# Patient Record
Sex: Female | Born: 1995 | Race: Black or African American | Hispanic: No | Marital: Single | Smoking: Never smoker
Health system: Southern US, Community
[De-identification: ages and names within clinical notes are randomized; demographics above are authoritative.]

---
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
## 2015-11-15 ENCOUNTER — Encounter (HOSPITAL_COMMUNITY): Payer: Self-pay | Admitting: Family Medicine

## 2015-11-15 ENCOUNTER — Emergency Department (HOSPITAL_COMMUNITY)
Admission: EM | Admit: 2015-11-15 | Discharge: 2015-11-15 | Disposition: A | Discharge status: Home / Self Care | Payer: No Typology Code available for payment source | Attending: Emergency Medicine | Admitting: Emergency Medicine

## 2015-11-15 DIAGNOSIS — Y998 Other external cause status: Secondary | ICD-10-CM | POA: Insufficient documentation

## 2015-11-15 DIAGNOSIS — S29002A Unspecified injury of muscle and tendon of back wall of thorax, initial encounter: Secondary | ICD-10-CM | POA: Diagnosis not present

## 2015-11-15 DIAGNOSIS — Z791 Long term (current) use of non-steroidal anti-inflammatories (NSAID): Secondary | ICD-10-CM | POA: Diagnosis not present

## 2015-11-15 DIAGNOSIS — S199XXA Unspecified injury of neck, initial encounter: Secondary | ICD-10-CM | POA: Insufficient documentation

## 2015-11-15 DIAGNOSIS — Y9389 Activity, other specified: Secondary | ICD-10-CM | POA: Diagnosis not present

## 2015-11-15 DIAGNOSIS — M542 Cervicalgia: Secondary | ICD-10-CM

## 2015-11-15 DIAGNOSIS — S8992XA Unspecified injury of left lower leg, initial encounter: Secondary | ICD-10-CM | POA: Insufficient documentation

## 2015-11-15 DIAGNOSIS — S0990XA Unspecified injury of head, initial encounter: Secondary | ICD-10-CM | POA: Diagnosis not present

## 2015-11-15 DIAGNOSIS — Y92410 Unspecified street and highway as the place of occurrence of the external cause: Secondary | ICD-10-CM | POA: Insufficient documentation

## 2015-11-15 MED ORDER — IBUPROFEN 400 MG PO TABS: 400.0000 mg | ORAL_TABLET | Freq: Three times a day (TID) | ORAL | Status: DC

## 2015-11-15 MED ORDER — CYCLOBENZAPRINE HCL 10 MG PO TABS: 10.0000 mg | ORAL_TABLET | Freq: Two times a day (BID) | ORAL | Status: DC | PRN

## 2015-11-15 MED ORDER — CYCLOBENZAPRINE HCL 10 MG PO TABS
10.0000 mg | ORAL_TABLET | Freq: Once | ORAL | Status: AC
  Administered 2015-11-15: 10 mg via ORAL
  Filled 2015-11-15: qty 1

## 2015-11-15 MED ORDER — IBUPROFEN 400 MG PO TABS
800.0000 mg | ORAL_TABLET | Freq: Once | ORAL | Status: AC
  Administered 2015-11-15: 800 mg via ORAL
  Filled 2015-11-15: qty 2

## 2015-11-15 MED ORDER — TRAMADOL HCL 50 MG PO TABS: 50.0000 mg | ORAL_TABLET | Freq: Four times a day (QID) | ORAL | Status: DC | PRN
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
## 2015-11-15 NOTE — ED Notes (Signed)
Patient able to ambulate independently  

## 2015-11-15 NOTE — Discharge Instructions (Signed)
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 exercises prescribed by your provider will help you have the flexibility and strength to maintain correct postures.  The correct posture provides the optimal environment for your joints to work. All of your joints have less wear and tear when properly supported by a spine with good posture. This means you will experience a healthier, less painful body.  Correct posture must be practiced with all of your activities, especially prolonged sitting and standing. Correct posture is as important when doing repetitive low-stress activities (typing) as it is when doing a single heavy-load activity (lifting). PROLONGED STANDING WHILE SLIGHTLY LEANING FORWARD When completing a task that requires you to lean forward while standing in one place for a long time, place either foot up on a  stationary 2- to 4-inch high object to help maintain the best posture. When both feet are on the ground, the low back  tends to lose its slight inward curve. If this curve flattens (or becomes too large), then the back and your other joints will experience too much stress, fatigue more quickly, and can cause pain.  RESTING POSITIONS Consider which positions are most painful for you when choosing a resting position. If you have pain with flexion-based activities (sitting, bending, stooping, squatting), choose a position that allows you to rest in a less flexed posture. You would want to avoid curling into a fetal position on your side. If your pain worsens with extension-based activities (prolonged standing, working overhead), avoid resting in an extended position such as sleeping on your stomach. Most people will find more comfort when they rest with their spine in a more neutral position, neither too rounded nor too arched. Lying on a non-sagging bed on your side with a pillow between your knees, or on your back with a pillow under your knees will often provide some relief. Keep in mind, being in any one position for a prolonged period of time, no matter how correct your posture, can still lead to stiffness. WALKING Walk with an upright posture. Your ears, shoulders, and hips should all line up. OFFICE WORK When working at a desk, create an environment that supports good, upright posture. Without extra support, muscles fatigue and lead to excessive strain on joints and other tissues. CHAIR:  A chair should be able to slide under your desk when your back makes contact with the back of the chair. This allows you to work closely.  The chair's height should allow your eyes to be level with the upper part of your monitor and your hands to be slightly lower than your elbows.  Body position:  Your feet should make contact with the floor. If this is not possible, use a foot rest.  Keep your ears over your shoulders. This will reduce stress on your neck and low back.   This information is not intended to replace advice  given to you by your health care provider. Make sure you discuss any questions you have with your health care provider.   Document Released: 09/08/2005 Document Revised: 09/29/2014 Document Reviewed: 12/21/2008 Elsevier Interactive Patient Education 2016 [employer]  Head Injury, Adult You have a head injury. Headaches and throwing up (vomiting) are common after a head injury. It should be easy to wake up from sleeping. Sometimes you must stay in the hospital. Most problems happen within the first 24 hours. Side effects may occur up to 7-10 days after the injury.  WHAT ARE THE TYPES OF HEAD INJURIES? Head injuries can be as minor as a bump. Some head injuries can be more severe. More severe head injuries include:  A jarring injury to the brain (concussion).  A bruise of the brain (contusion). This mean there is bleeding in the brain that can cause swelling.  A cracked skull (skull fracture).  Bleeding in the brain that collects, clots, and forms a bump (hematoma). WHEN SHOULD I GET HELP RIGHT AWAY?   You are confused or sleepy.  You cannot be woken up.  You feel sick to your stomach (nauseous) or keep throwing up (vomiting).  Your dizziness or unsteadiness is getting worse.  You have very bad, lasting headaches that are not helped by medicine. Take medicines only as told by your doctor.  You cannot use your arms or legs  like normal.  You cannot walk.  You notice changes in the black spots in the center of the colored part of your eye (pupil).  You have clear or bloody fluid coming from your nose or ears.  You have trouble seeing. During the next 24 hours after the injury, you must stay with someone who can watch you. This person should get help right away (call 911 in the [place] if you start to shake and are not able to control it (have seizures), you pass out, or you are unable to wake up. HOW CAN I PREVENT A HEAD INJURY IN THE FUTURE?  Wear seat belts.  Wear a helmet  while bike riding and playing sports like football.  Stay away from dangerous activities around the house. WHEN CAN I RETURN TO NORMAL ACTIVITIES AND ATHLETICS? See your doctor before doing these activities. You should not do normal activities or play contact sports until 1 week after the following symptoms have stopped:  Headache that does not go away.  Dizziness.  Poor attention.  Confusion.  Memory problems.  Sickness to your stomach or throwing up.  Tiredness.  Fussiness.  Bothered by bright lights or loud noises.  Anxiousness or depression.  Restless sleep. MAKE SURE YOU:   Understand these instructions.  Will watch your condition.  Will get help right away if you are not doing well or get worse.   This information is not intended to replace advice given to you by your health care provider. Make sure you discuss any questions you have with your health care provider.   Document Released: 08/21/2008 Document Revised: 09/29/2014 Document Reviewed: 05/16/2013 Elsevier Interactive Patient Education [employer]
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
## 2015-12-07 ENCOUNTER — Emergency Department (HOSPITAL_COMMUNITY): Payer: Self-pay

## 2015-12-07 ENCOUNTER — Emergency Department (HOSPITAL_COMMUNITY)
Admission: EM | Admit: 2015-12-07 | Discharge: 2015-12-07 | Disposition: A | Discharge status: Home / Self Care | Payer: Self-pay | Attending: Emergency Medicine | Admitting: Emergency Medicine

## 2015-12-07 ENCOUNTER — Encounter (HOSPITAL_COMMUNITY): Payer: Self-pay | Admitting: Physical Medicine and Rehabilitation

## 2015-12-07 DIAGNOSIS — Y9389 Activity, other specified: Secondary | ICD-10-CM | POA: Insufficient documentation

## 2015-12-07 DIAGNOSIS — M542 Cervicalgia: Secondary | ICD-10-CM

## 2015-12-07 DIAGNOSIS — S199XXA Unspecified injury of neck, initial encounter: Secondary | ICD-10-CM | POA: Insufficient documentation

## 2015-12-07 DIAGNOSIS — Y92410 Unspecified street and highway as the place of occurrence of the external cause: Secondary | ICD-10-CM | POA: Insufficient documentation

## 2015-12-07 DIAGNOSIS — Y998 Other external cause status: Secondary | ICD-10-CM | POA: Insufficient documentation

## 2015-12-07 DIAGNOSIS — S0990XA Unspecified injury of head, initial encounter: Secondary | ICD-10-CM | POA: Insufficient documentation

## 2015-12-07 DIAGNOSIS — S8991XA Unspecified injury of right lower leg, initial encounter: Secondary | ICD-10-CM | POA: Insufficient documentation

## 2015-12-07 MED ORDER — IBUPROFEN 800 MG PO TABS: 800.0000 mg | ORAL_TABLET | Freq: Three times a day (TID) | ORAL | Status: DC

## 2015-12-07 MED ORDER — OXYCODONE-ACETAMINOPHEN 5-325 MG PO TABS
2.0000 | ORAL_TABLET | Freq: Once | ORAL | Status: AC
  Administered 2015-12-07: 2 via ORAL
  Filled 2015-12-07: qty 2

## 2015-12-07 MED ORDER — HYDROCODONE-ACETAMINOPHEN 5-325 MG PO TABS: 1.0000 | ORAL_TABLET | Freq: Four times a day (QID) | ORAL | Status: DC | PRN

## 2015-12-07 MED ORDER — CYCLOBENZAPRINE HCL 10 MG PO TABS: 10.0000 mg | ORAL_TABLET | Freq: Two times a day (BID) | ORAL | Status: DC | PRN

## 2015-12-07 NOTE — Progress Notes (Signed)
Orthopedic Tech Progress Note Patient Details:  Anita Knapp 1996/09/03 [id number]  Ortho Devices Type of Ortho Device: Knee Immobilizer Ortho Device/Splint Interventions: Application   Anita Knapp 12/07/2015, 1:58 PM
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
## 2015-12-07 NOTE — ED Notes (Addendum)
[facility] called for bariatric walker.

## 2015-12-07 NOTE — ED Notes (Signed)
Pt states she does not want walker due to insurance coverage. Pt discharged home.

## 2015-12-07 NOTE — ED Notes (Signed)
Patient wanting to go ahead and leave; patient informed by Aundra MilletMegan, RN that a walker has already been ordered and [facility] is sending the walker to the hospital via courier; patient is willing to wait; visitor at bedside

## 2015-12-07 NOTE — ED Notes (Signed)
Pt transported to radiology.

## 2015-12-07 NOTE — ED Notes (Signed)
Pt refusing walker due to cost, pt family member states she has one at home. Pt given a wheelchair out

## 2015-12-07 NOTE — ED Notes (Signed)
Patient undressed, in gown, on continuous pulse oximetry and blood pressure cuff 

## 2015-12-07 NOTE — Discharge Instructions (Signed)
Concussion, Adult °A concussion, or closed-head injury, is a brain injury caused by a direct blow to the head or by a quick and sudden movement (jolt) of the head or neck. Concussions are usually not life-threatening. Even so, the effects of a concussion can be serious. If you have had a concussion before, you are more likely to experience concussion-like symptoms after a direct blow to the head.  °CAUSES °· Direct blow to the head, such as from running into another player during a soccer game, being hit in a fight, or hitting your head on a hard surface. °· A jolt of the head or neck that causes the brain to move back and forth inside the skull, such as in a car crash. °SIGNS AND SYMPTOMS °The signs of a concussion can be hard to notice. Early on, they may be missed by you, family members, and health care providers. You may look fine but act or feel differently. °Symptoms are usually temporary, but they may last for days, weeks, or even longer. Some symptoms may appear right away while others may not show up for hours or days. Every head injury is different. Symptoms include: °· Mild to moderate headaches that will not go away. °· A feeling of pressure inside your head. °· Having more trouble than usual: °¨ Learning or remembering things you have heard. °¨ Answering questions. °¨ Paying attention or concentrating. °¨ Organizing daily tasks. °¨ Making decisions and solving problems. °· Slowness in thinking, acting or reacting, speaking, or reading. °· Getting lost or being easily confused. °· Feeling tired all the time or lacking energy (fatigued). °· Feeling drowsy. °· Sleep disturbances. °¨ Sleeping more than usual. °¨ Sleeping less than usual. °¨ Trouble falling asleep. °¨ Trouble sleeping (insomnia). °· Loss of balance or feeling lightheaded or dizzy. °· Nausea or vomiting. °· Numbness or tingling. °· Increased sensitivity to: °¨ Sounds. °¨ Lights. °¨ Distractions. °· Vision problems or eyes that tire  easily. °· Diminished sense of taste or smell. °· Ringing in the ears. °· Mood changes such as feeling sad or anxious. °· Becoming easily irritated or angry for little or no reason. °· Lack of motivation. °· Seeing or hearing things other people do not see or hear (hallucinations). °DIAGNOSIS °Your health care provider can usually diagnose a concussion based on a description of your injury and symptoms. He or she will ask whether you passed out (lost consciousness) and whether you are having trouble remembering events that happened right before and during your injury. °Your evaluation might include: °· A brain scan to look for signs of injury to the brain. Even if the test shows no injury, you may still have a concussion. °· Blood tests to be sure other problems are not present. °TREATMENT °· Concussions are usually treated in an emergency department, in urgent care, or at a clinic. You may need to stay in the hospital overnight for further treatment. °· Tell your health care provider if you are taking any medicines, including prescription medicines, over-the-counter medicines, and natural remedies. Some medicines, such as blood thinners (anticoagulants) and aspirin, may increase the chance of complications. Also tell your health care provider whether you have had alcohol or are taking illegal drugs. This information may affect treatment. °· Your health care provider will send you home with important instructions to follow. °· How fast you will recover from a concussion depends on many factors. These factors include how severe your concussion is, what part of your brain was injured,   your age, and how healthy you were before the concussion.  Most people with mild injuries recover fully. Recovery can take time. In general, recovery is slower in older persons. Also, persons who have had a concussion in the past or have other medical problems may find that it takes longer to recover from their current injury. HOME  CARE INSTRUCTIONS General Instructions  Carefully follow the directions your health care provider gave you.  Only take over-the-counter or prescription medicines for pain, discomfort, or fever as directed by your health care provider.  Take only those medicines that your health care provider has approved.  Do not drink alcohol until your health care provider says you are well enough to do so. Alcohol and certain other drugs may slow your recovery and can put you at risk of further injury.  If it is harder than usual to remember things, write them down.  If you are easily distracted, try to do one thing at a time. For example, do not try to watch TV while fixing dinner.  Talk with family members or close friends when making important decisions.  Keep all follow-up appointments. Repeated evaluation of your symptoms is recommended for your recovery.  Watch your symptoms and tell others to do the same. Complications sometimes occur after a concussion. Older adults with a brain injury may have a higher risk of serious complications, such as a blood clot on the brain.  Tell your teachers, school nurse, school counselor, coach, athletic trainer, or work Freight forwarder about your injury, symptoms, and restrictions. Tell them about what you can or cannot do. They should watch for:  Increased problems with attention or concentration.  Increased difficulty remembering or learning new information.  Increased time needed to complete tasks or assignments.  Increased irritability or decreased ability to cope with stress.  Increased symptoms.  Rest. Rest helps the brain to heal. Make sure you:  Get plenty of sleep at night. Avoid staying up late at night.  Keep the same bedtime hours on weekends and weekdays.  Rest during the day. Take daytime naps or rest breaks when you feel tired.  Limit activities that require a lot of thought or concentration. These include:  Doing homework or job-related  work.  Watching TV.  Working on the computer.  Avoid any situation where there is potential for another head injury (football, hockey, soccer, basketball, martial arts, downhill snow sports and horseback riding). Your condition will get worse every time you experience a concussion. You should avoid these activities until you are evaluated by the appropriate follow-up health care providers. Returning To Your Regular Activities You will need to return to your normal activities slowly, not all at once. You must give your body and brain enough time for recovery.  Do not return to sports or other athletic activities until your health care provider tells you it is safe to do so.  Ask your health care provider when you can drive, ride a bicycle, or operate heavy machinery. Your ability to react may be slower after a brain injury. Never do these activities if you are dizzy.  Ask your health care provider about when you can return to work or school. Preventing Another Concussion It is very important to avoid another brain injury, especially before you have recovered. In rare cases, another injury can lead to permanent brain damage, brain swelling, or death. The risk of this is greatest during the first 7-10 days after a head injury. Avoid injuries by:  Wearing a  seat belt when riding in a car.  Drinking alcohol only in moderation.  Wearing a helmet when biking, skiing, skateboarding, skating, or doing similar activities.  Avoiding activities that could lead to a second concussion, such as contact or recreational sports, until your health care provider says it is okay.  Taking safety measures in your home.  Remove clutter and tripping hazards from floors and stairways.  Use grab bars in bathrooms and handrails by stairs.  Place non-slip mats on floors and in bathtubs.  Improve lighting in dim areas. SEEK MEDICAL CARE IF:  You have increased problems paying attention or  concentrating.  You have increased difficulty remembering or learning new information.  You need more time to complete tasks or assignments than before.  You have increased irritability or decreased ability to cope with stress.  You have more symptoms than before. Seek medical care if you have any of the following symptoms for more than 2 weeks after your injury:  Lasting (chronic) headaches.  Dizziness or balance problems.  Nausea.  Vision problems.  Increased sensitivity to noise or light.  Depression or mood swings.  Anxiety or irritability.  Memory problems.  Difficulty concentrating or paying attention.  Sleep problems.  Feeling tired all the time. SEEK IMMEDIATE MEDICAL CARE IF:  You have severe or worsening headaches. These may be a sign of a blood clot in the brain.  You have weakness (even if only in one hand, leg, or part of the face).  You have numbness.  You have decreased coordination.  You vomit repeatedly.  You have increased sleepiness.  One pupil is larger than the other.  You have convulsions.  You have slurred speech.  You have increased confusion. This may be a sign of a blood clot in the brain.  You have increased restlessness, agitation, or irritability.  You are unable to recognize people or places.  You have neck pain.  It is difficult to wake you up.  You have unusual behavior changes.  You lose consciousness. MAKE SURE YOU:  Understand these instructions.  Will watch your condition.  Will get help right away if you are not doing well or get worse.   This information is not intended to replace advice given to you by your health care provider. Make sure you discuss any questions you have with your health care provider.   Document Released: 11/29/2003 Document Revised: 09/29/2014 Document Reviewed: 03/31/2013 [employer] Interactive Patient Education 2016 [employer]  Cervical Strain and Sprain With Rehab Cervical  strain and sprain are injuries that commonly occur with "whiplash" injuries. Whiplash occurs when the neck is forcefully whipped backward or forward, such as during a motor vehicle accident or during contact sports. The muscles, ligaments, tendons, discs, and nerves of the neck are susceptible to injury when this occurs. RISK FACTORS Risk of having a whiplash injury increases if:  Osteoarthritis of the spine.  Situations that make head or neck accidents or trauma more likely.  High-risk sports (football, rugby, wrestling, hockey, auto racing, gymnastics, diving, contact karate, or boxing).  Poor strength and flexibility of the neck.  Previous neck injury.  Poor tackling technique.  Improperly fitted or padded equipment. SYMPTOMS   Pain or stiffness in the front or back of neck or both.  Symptoms may present immediately or up to 24 hours after injury.  Dizziness, headache, nausea, and vomiting.  Muscle spasm with soreness and stiffness in the neck.  Tenderness and swelling at the injury site. PREVENTION  Learn  and use proper technique (avoid tackling with the head, spearing, and head-butting; use proper falling techniques to avoid landing on the head).  Warm up and stretch properly before activity.  Maintain physical fitness:  Strength, flexibility, and endurance.  Cardiovascular fitness.  Wear properly fitted and padded protective equipment, such as padded soft collars, for participation in contact sports. PROGNOSIS  Recovery from cervical strain and sprain injuries is dependent on the extent of the injury. These injuries are usually curable in 1 week to 3 months with appropriate treatment.  RELATED COMPLICATIONS   Temporary numbness and weakness may occur if the nerve roots are damaged, and this may persist until the nerve has completely healed.  Chronic pain due to frequent recurrence of symptoms.  Prolonged healing, especially if activity is resumed too soon  (before complete recovery). TREATMENT  Treatment initially involves the use of ice and medication to help reduce pain and inflammation. It is also important to perform strengthening and stretching exercises and modify activities that worsen symptoms so the injury does not get worse. These exercises may be performed at home or with a therapist. For patients who experience severe symptoms, a soft, padded collar may be recommended to be worn around the neck.  Improving your posture may help reduce symptoms. Posture improvement includes pulling your chin and abdomen in while sitting or standing. If you are sitting, sit in a firm chair with your buttocks against the back of the chair. While sleeping, try replacing your pillow with a small towel rolled to 2 inches in diameter, or use a cervical pillow or soft cervical collar. Poor sleeping positions delay healing.  For patients with nerve root damage, which causes numbness or weakness, the use of a cervical traction apparatus may be recommended. Surgery is rarely necessary for these injuries. However, cervical strain and sprains that are present at birth (congenital) may require surgery. MEDICATION   If pain medication is necessary, nonsteroidal anti-inflammatory medications, such as aspirin and ibuprofen, or other minor pain relievers, such as acetaminophen, are often recommended.  Do not take pain medication for 7 days before surgery.  Prescription pain relievers may be given if deemed necessary by your caregiver. Use only as directed and only as much as you need. HEAT AND COLD:   Cold treatment (icing) relieves pain and reduces inflammation. Cold treatment should be applied for 10 to 15 minutes every 2 to 3 hours for inflammation and pain and immediately after any activity that aggravates your symptoms. Use ice packs or an ice massage.  Heat treatment may be used prior to performing the stretching and strengthening activities prescribed by your  caregiver, physical therapist, or athletic trainer. Use a heat pack or a warm soak. SEEK MEDICAL CARE IF:   Symptoms get worse or do not improve in 2 weeks despite treatment.  New, unexplained symptoms develop (drugs used in treatment may produce side effects). EXERCISES RANGE OF MOTION (ROM) AND STRETCHING EXERCISES - Cervical Strain and Sprain These exercises may help you when beginning to rehabilitate your injury. In order to successfully resolve your symptoms, you must improve your posture. These exercises are designed to help reduce the forward-head and rounded-shoulder posture which contributes to this condition. Your symptoms may resolve with or without further involvement from your physician, physical therapist or athletic trainer. While completing these exercises, remember:   Restoring tissue flexibility helps normal motion to return to the joints. This allows healthier, less painful movement and activity.  An effective stretch should be held for  at least 20 seconds, although you may need to begin with shorter hold times for comfort.  A stretch should never be painful. You should only feel a gentle lengthening or release in the stretched tissue. STRETCH- Axial Extensors  Lie on your back on the floor. You may bend your knees for comfort. Place a rolled-up hand towel or dish towel, about 2 inches in diameter, under the part of your head that makes contact with the floor.  Gently tuck your chin, as if trying to make a "double chin," until you feel a gentle stretch at the base of your head.  Hold __________ seconds. Repeat __________ times. Complete this exercise __________ times per day.  STRETCH - Axial Extension   Stand or sit on a firm surface. Assume a good posture: chest up, shoulders drawn back, abdominal muscles slightly tense, knees unlocked (if standing) and feet hip width apart.  Slowly retract your chin so your head slides back and your chin slightly lowers. Continue to  look straight ahead.  You should feel a gentle stretch in the back of your head. Be certain not to feel an aggressive stretch since this can cause headaches later.  Hold for __________ seconds. Repeat __________ times. Complete this exercise __________ times per day. STRETCH - Cervical Side Bend   Stand or sit on a firm surface. Assume a good posture: chest up, shoulders drawn back, abdominal muscles slightly tense, knees unlocked (if standing) and feet hip width apart.  Without letting your nose or shoulders move, slowly tip your right / left ear to your shoulder until your feel a gentle stretch in the muscles on the opposite side of your neck.  Hold __________ seconds. Repeat __________ times. Complete this exercise __________ times per day. STRETCH - Cervical Rotators   Stand or sit on a firm surface. Assume a good posture: chest up, shoulders drawn back, abdominal muscles slightly tense, knees unlocked (if standing) and feet hip width apart.  Keeping your eyes level with the ground, slowly turn your head until you feel a gentle stretch along the back and opposite side of your neck.  Hold __________ seconds. Repeat __________ times. Complete this exercise __________ times per day. RANGE OF MOTION - Neck Circles   Stand or sit on a firm surface. Assume a good posture: chest up, shoulders drawn back, abdominal muscles slightly tense, knees unlocked (if standing) and feet hip width apart.  Gently roll your head down and around from the back of one shoulder to the back of the other. The motion should never be forced or painful.  Repeat the motion 10-20 times, or until you feel the neck muscles relax and loosen. Repeat __________ times. Complete the exercise __________ times per day. STRENGTHENING EXERCISES - Cervical Strain and Sprain These exercises may help you when beginning to rehabilitate your injury. They may resolve your symptoms with or without further involvement from your  physician, physical therapist, or athletic trainer. While completing these exercises, remember:   Muscles can gain both the endurance and the strength needed for everyday activities through controlled exercises.  Complete these exercises as instructed by your physician, physical therapist, or athletic trainer. Progress the resistance and repetitions only as guided.  You may experience muscle soreness or fatigue, but the pain or discomfort you are trying to eliminate should never worsen during these exercises. If this pain does worsen, stop and make certain you are following the directions exactly. If the pain is still present after adjustments, discontinue the exercise  until you can discuss the trouble with your clinician. STRENGTH - Cervical Flexors, Isometric  Face a wall, standing about 6 inches away. Place a small pillow, a ball about 6-8 inches in diameter, or a folded towel between your forehead and the wall.  Slightly tuck your chin and gently push your forehead into the soft object. Push only with mild to moderate intensity, building up tension gradually. Keep your jaw and forehead relaxed.  Hold 10 to 20 seconds. Keep your breathing relaxed.  Release the tension slowly. Relax your neck muscles completely before you start the next repetition. Repeat __________ times. Complete this exercise __________ times per day. STRENGTH- Cervical Lateral Flexors, Isometric   Stand about 6 inches away from a wall. Place a small pillow, a ball about 6-8 inches in diameter, or a folded towel between the side of your head and the wall.  Slightly tuck your chin and gently tilt your head into the soft object. Push only with mild to moderate intensity, building up tension gradually. Keep your jaw and forehead relaxed.  Hold 10 to 20 seconds. Keep your breathing relaxed.  Release the tension slowly. Relax your neck muscles completely before you start the next repetition. Repeat __________ times.  Complete this exercise __________ times per day. STRENGTH - Cervical Extensors, Isometric   Stand about 6 inches away from a wall. Place a small pillow, a ball about 6-8 inches in diameter, or a folded towel between the back of your head and the wall.  Slightly tuck your chin and gently tilt your head back into the soft object. Push only with mild to moderate intensity, building up tension gradually. Keep your jaw and forehead relaxed.  Hold 10 to 20 seconds. Keep your breathing relaxed.  Release the tension slowly. Relax your neck muscles completely before you start the next repetition. Repeat __________ times. Complete this exercise __________ times per day. POSTURE AND BODY MECHANICS CONSIDERATIONS - Cervical Strain and Sprain Keeping correct posture when sitting, standing or completing your activities will reduce the stress put on different body tissues, allowing injured tissues a chance to heal and limiting painful experiences. The following are general guidelines for improved posture. Your physician or physical therapist will provide you with any instructions specific to your needs. While reading these guidelines, remember:  The exercises prescribed by your provider will help you have the flexibility and strength to maintain correct postures.  The correct posture provides the optimal environment for your joints to work. All of your joints have less wear and tear when properly supported by a spine with good posture. This means you will experience a healthier, less painful body.  Correct posture must be practiced with all of your activities, especially prolonged sitting and standing. Correct posture is as important when doing repetitive low-stress activities (typing) as it is when doing a single heavy-load activity (lifting). PROLONGED STANDING WHILE SLIGHTLY LEANING FORWARD When completing a task that requires you to lean forward while standing in one place for a long time, place either foot  up on a stationary 2- to 4-inch high object to help maintain the best posture. When both feet are on the ground, the low back tends to lose its slight inward curve. If this curve flattens (or becomes too large), then the back and your other joints will experience too much stress, fatigue more quickly, and can cause pain.  RESTING POSITIONS Consider which positions are most painful for you when choosing a resting position. If you have pain with flexion-based  activities (sitting, bending, stooping, squatting), choose a position that allows you to rest in a less flexed posture. You would want to avoid curling into a fetal position on your side. If your pain worsens with extension-based activities (prolonged standing, working overhead), avoid resting in an extended position such as sleeping on your stomach. Most people will find more comfort when they rest with their spine in a more neutral position, neither too rounded nor too arched. Lying on a non-sagging bed on your side with a pillow between your knees, or on your back with a pillow under your knees will often provide some relief. Keep in mind, being in any one position for a prolonged period of time, no matter how correct your posture, can still lead to stiffness. WALKING Walk with an upright posture. Your ears, shoulders, and hips should all line up. OFFICE WORK When working at a desk, create an environment that supports good, upright posture. Without extra support, muscles fatigue and lead to excessive strain on joints and other tissues. CHAIR:  A chair should be able to slide under your desk when your back makes contact with the back of the chair. This allows you to work closely.  The chair's height should allow your eyes to be level with the upper part of your monitor and your hands to be slightly lower than your elbows.  Body position:  Your feet should make contact with the floor. If this is not possible, use a foot rest.  Keep your ears  over your shoulders. This will reduce stress on your neck and low back.   This information is not intended to replace advice given to you by your health care provider. Make sure you discuss any questions you have with your health care provider.   Document Released: 09/08/2005 Document Revised: 09/29/2014 Document Reviewed: 12/21/2008 [employer] Interactive Patient Education 2016 [employer]  Head Injury, Adult You have a head injury. Headaches and throwing up (vomiting) are common after a head injury. It should be easy to wake up from sleeping. Sometimes you must stay in the hospital. Most problems happen within the first 24 hours. Side effects may occur up to 7-10 days after the injury.  WHAT ARE THE TYPES OF HEAD INJURIES? Head injuries can be as minor as a bump. Some head injuries can be more severe. More severe head injuries include:  A jarring injury to the brain (concussion).  A bruise of the brain (contusion). This mean there is bleeding in the brain that can cause swelling.  A cracked skull (skull fracture).  Bleeding in the brain that collects, clots, and forms a bump (hematoma). WHEN SHOULD I GET HELP RIGHT AWAY?   You are confused or sleepy.  You cannot be woken up.  You feel sick to your stomach (nauseous) or keep throwing up (vomiting).  Your dizziness or unsteadiness is getting worse.  You have very bad, lasting headaches that are not helped by medicine. Take medicines only as told by your doctor.  You cannot use your arms or legs like normal.  You cannot walk.  You notice changes in the black spots in the center of the colored part of your eye (pupil).  You have clear or bloody fluid coming from your nose or ears.  You have trouble seeing. During the next 24 hours after the injury, you must stay with someone who can watch you. This person should get help right away (call 911 in the [place] if you start to shake and are  not able to control it (have seizures), you  pass out, or you are unable to wake up. HOW CAN I PREVENT A HEAD INJURY IN THE FUTURE?  Wear seat belts.  Wear a helmet while bike riding and playing sports like football.  Stay away from dangerous activities around the house. WHEN CAN I RETURN TO NORMAL ACTIVITIES AND ATHLETICS? See your doctor before doing these activities. You should not do normal activities or play contact sports until 1 week after the following symptoms have stopped:  Headache that does not go away.  Dizziness.  Poor attention.  Confusion.  Memory problems.  Sickness to your stomach or throwing up.  Tiredness.  Fussiness.  Bothered by bright lights or loud noises.  Anxiousness or depression.  Restless sleep. MAKE SURE YOU:   Understand these instructions.  Will watch your condition.  Will get help right away if you are not doing well or get worse.   This information is not intended to replace advice given to you by your health care provider. Make sure you discuss any questions you have with your health care provider.   Document Released: 08/21/2008 Document Revised: 09/29/2014 Document Reviewed: 05/16/2013 Elsevier Interactive Patient Education 2016 Else[employer] Inc[employer] Knee Sprain A knee sprain is a tear in one of the strong, fibrous tissues that connect the bones (ligaments) in your knee. The severity of the sprain depends on how much of the ligament is torn. The tear can be either partial or complete. CAUSES  Often, sprains are a result of a fall or injury. The force of the impact causes the fibers of your ligament to stretch too much. This excess tension causes the fibers of your ligament to tear. SIGNS AND SYMPTOMS  You may have some loss of motion in your knee. Other symptoms include:  Bruising.  Pain in the knee area.  Tenderness of the knee to the touch.  Swelling. DIAGNOSIS  To diagnose a knee sprain, your health care provider will physically examine your knee. Your health care  provider may also suggest an X-ray exam of your knee to make sure no bones are broken. TREATMENT  If your ligament is only partially torn, treatment usually involves keeping the knee in a fixed position (immobilization) or bracing your knee for activities that require movement for several weeks. To do this, your health care provider will apply a bandage, cast, or splint to keep your knee from moving and to support your knee during movement until it heals. For a partially torn ligament, the healing process usually takes 4-6 weeks. If your ligament is completely torn, depending on which ligament it is, you may need surgery to reconnect the ligament to the bone or reconstruct it. After surgery, a cast or splint may be applied and will need to stay on your knee for 4-6 weeks while your ligament heals. HOME CARE INSTRUCTIONS  Keep your injured knee elevated to decrease swelling.  To ease pain and swelling, apply ice to the injured area:  Put ice in a plastic bag.  Place a towel between your skin and the bag.  Leave the ice on for 20 minutes, 2-3 times a day.  Only take medicine for pain as directed by your health care provider.  Do not leave your knee unprotected until pain and stiffness go away (usually 4-6 weeks).  If you have a cast or splint, do not allow it to get wet. If you have been instructed not to remove it, cover it with a plastic bag  when you shower or bathe. Do not swim.  Your health care provider may suggest exercises for you to do during your recovery to prevent or limit permanent weakness and stiffness. SEEK IMMEDIATE MEDICAL CARE IF:  Your cast or splint becomes damaged.  Your pain becomes worse.  You have significant pain, swelling, or numbness below the cast or splint. MAKE SURE YOU:  Understand these instructions.  Will watch your condition.  Will get help right away if you are not doing well or get worse.   This information is not intended to replace advice  given to you by your health care provider. Make sure you discuss any questions you have with your health care provider.   Document Released: 09/08/2005 Document Revised: 09/29/2014 Document Reviewed: 04/20/2013 Elsevier Interactive Patient Education [employer]
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
## 2017-02-04 ENCOUNTER — Encounter (HOSPITAL_COMMUNITY): Payer: Self-pay

## 2017-02-04 ENCOUNTER — Emergency Department (HOSPITAL_COMMUNITY)
Admission: EM | Admit: 2017-02-04 | Discharge: 2017-02-04 | Disposition: A | Discharge status: Home / Self Care | Payer: Medicaid Other | Attending: Emergency Medicine | Admitting: Emergency Medicine

## 2017-02-04 DIAGNOSIS — K0510 Chronic gingivitis, plaque induced: Secondary | ICD-10-CM | POA: Insufficient documentation

## 2017-02-04 DIAGNOSIS — K0889 Other specified disorders of teeth and supporting structures: Secondary | ICD-10-CM

## 2017-02-04 DIAGNOSIS — Z79899 Other long term (current) drug therapy: Secondary | ICD-10-CM | POA: Insufficient documentation

## 2017-02-04 MED ORDER — IBUPROFEN 800 MG PO TABS: 800.0000 mg | ORAL_TABLET | Freq: Three times a day (TID) | ORAL | 0 refills | Status: DC

## 2017-02-04 MED ORDER — CHLORHEXIDINE GLUCONATE 0.12 % MT SOLN: 15.0000 mL | Freq: Two times a day (BID) | OROMUCOSAL | 0 refills | Status: DC

## 2017-02-04 NOTE — Discharge Instructions (Addendum)
Follow-up with one of the referred dentist listed. Call them and arrange to be seen in the next 24-48 hours.  Use antibacterial mouthwash as directed.  Take ibuprofen as directed for pain.  Make sure you are brushing her teeth.  Return the emergency Department for any fever, difficulty breathing, difficulty swallowing worsening pain or any other worsening or concerning symptoms.

## 2017-02-04 NOTE — ED Provider Notes (Signed)
[facility]-EMERGENCY DEPT Provider Note   CSN: [id number] Arrival date & time: 02/04/17  1105  By signing my name below, Alexia Julien GirtPerkins, attest that this documentation has been prepared under the direction and in the presence of non physician practitioner, Graciella FreerLindsey Vernard Gram, PA-C.    Electronically Signed: Sandrea HammondAlexia Perkins, Scribe 02/04/2017. 11:27 AM.    History   Chief Complaint Chief Complaint  Patient presents with  . Oral Swelling   The history is provided by the patient. No language interpreter was used.   HPI Comments:  Anita Knapp is a 21 y.o. female who presents to the Emergency Department complaining of constant lower gum swelling that worsened 2 days ago. Patient states that she did experience mild dental pain and gum swelling for the past week. She reports that pain is worsened by brushing her teeth or eating, but states she has been able to tolerate PO and her secretions. She reports associated mild sore throat. She states that she took ibuprofen x2 with no relief of symptoms. Patient reports difficulty with her wisdom teeth that has caused secondary pain.  She does not have dental follow up. Pt denies fever, facial swelling, difficulty swallowing.    History reviewed. No pertinent past medical history.  There are no active problems to display for this patient.   History reviewed. No pertinent surgical history.  OB History    No data available       Home Medications    Prior to Admission medications   Medication Sig Start Date End Date Taking? Authorizing Provider  chlorhexidine (PERIDEX) 0.12 % solution Use as directed 15 mLs in the mouth or throat 2 (two) times daily. 02/04/17   Maxwell CaulLayden, Phillp Dolores A, PA-C  cyclobenzaprine (FLEXERIL) 10 MG tablet Take 1 tablet (10 mg total) by mouth 2 (two) times daily as needed for muscle spasms. 12/07/15   Danelle Berryapia, Leisa, PA-C  HYDROcodone-acetaminophen (NORCO/VICODIN) 5-325 MG tablet Take 1-2 tablets by mouth every 6 (six) hours as needed for  severe pain. 12/07/15   Danelle Berryapia, Leisa, PA-C  ibuprofen (ADVIL,MOTRIN) 800 MG tablet Take 1 tablet (800 mg total) by mouth 3 (three) times daily. 02/04/17   Maxwell CaulLayden, Rainn Bullinger A, PA-C  traMADol (ULTRAM) 50 MG tablet Take 1 tablet (50 mg total) by mouth every 6 (six) hours as needed. Patient not taking: Reported on 12/07/2015 11/15/15   Danelle Berryapia, Leisa, PA-C    Family History History reviewed. No pertinent family history.  Social History Social History  Substance Use Topics  . Smoking status: Never Smoker  . Smokeless tobacco: Never Used  . Alcohol use No     Allergies   Patient has no known allergies.   Review of Systems Review of Systems  Constitutional: Negative for fever.  HENT: Positive for dental problem and sore throat. Negative for facial swelling and trouble swallowing.      Physical Exam Updated Vital Signs BP (!) 120/94 (BP Location: Right Arm)   Pulse (!) 114   Temp 99 F (37.2 C) (Oral)   Resp 16   Ht 5\' 6"  (1.676 m)   Wt (!) 152 kg   LMP 01/26/2017   SpO2 98%   BMI 54.10 kg/m   Physical Exam  Constitutional: She appears well-developed and well-nourished.  Sitting completely on examination table  HENT:  Head: Normocephalic and atraumatic.  Mouth/Throat: Oropharynx is clear and moist and mucous membranes are normal. No oropharyngeal exudate, posterior oropharyngeal edema or posterior oropharyngeal erythema.  Dffuse gingival swelling to lower gums. No erythema. No  evidence of dental abscess. No trismus. No facial swelling. Posterior oropharynx without erythema, edema, or exudates. No evidence of peritonsillar abscess. Partially impacted wisdom tooth on the left lower side.   Eyes: Conjunctivae and EOM are normal. Right eye exhibits no discharge. Left eye exhibits no discharge. No scleral icterus.  Pulmonary/Chest: Effort normal. No accessory muscle usage. No respiratory distress.  No signs of respiratory distress. Able to speak in full sentences without difficulty.     Musculoskeletal: She exhibits no deformity.  Neurological: She is alert.  Skin: Skin is warm and dry.  Psychiatric: She has a normal mood and affect. Her speech is normal and behavior is normal.  Nursing note and vitals reviewed.    ED Treatments / Results  DIAGNOSTIC STUDIES:  Oxygen Saturation is 98% on room air, normal, by my interpretation.    COORDINATION OF CARE:  11:25 AM Discussed treatment plan with pt at bedside and pt agreed to plan.  Labs (all labs ordered are listed, but only abnormal results are displayed) Labs Reviewed - No data to display  EKG  EKG Interpretation None       Radiology No results found.  Procedures Procedures (including critical care time)  Medications Ordered in ED Medications - No data to display   Initial Impression / Assessment and Plan / ED Course  I have reviewed the triage vital signs and the nursing notes.  Pertinent labs & imaging results that were available during my care of the patient were reviewed by me and considered in my medical decision making (see chart for details).     21 year old female with 1 week of gum swelling that worsened in the last 2 days. Patient is afebrile, non-toxic appearing, sitting comfortably on examination table. Consider gingivitis. No evidence of dental abscess on physical exam. History/physical exam are not concerning for a peritonsillar abscess or Ludwig's angina. Symptoms likely secondary to pre-existing gingivitis and partial impaction of lower wisdom tooth. Discussed patient with Dr. Madilyn Hook. Will plan to treat symptomatically with Mouthwash and ibuprofen for pain. Instructed patient to follow-up with dental referrals for further evaluation. Strict return precautions given. Patient expresses understanding and agreement to plan.   Final Clinical Impressions(s) / ED Diagnoses   Final diagnoses:  Gingivitis  Pain, dental    New Prescriptions New Prescriptions   CHLORHEXIDINE (PERIDEX) 0.12  % SOLUTION    Use as directed 15 mLs in the mouth or throat 2 (two) times daily.   IBUPROFEN (ADVIL,MOTRIN) 800 MG TABLET    Take 1 tablet (800 mg total) by mouth 3 (three) times daily.   I personally performed the services described in this documentation, which was scribed in my presence. The recorded information has been reviewed and is accurate.     Maxwell Caul, PA-C 02/04/17 1156    Tilden Fossa, MD 02/04/17 1320

## 2017-02-04 NOTE — ED Triage Notes (Signed)
PT C/O GUM SWELLING AND MOUTH PAIN X2 DAYS. PT DENIES FEVER.

## 2017-02-04 NOTE — ED Notes (Signed)
PT DISCHARGED. INSTRUCTIONS AND PRESCRIPTIONS GIVEN. AAOX4. PT IN NO APPARENT DISTRESS WITH MODERATE PAIN. THE OPPORTUNITY TO ASK QUESTIONS WAS PROVIDED. 
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
## 2019-06-08 ENCOUNTER — Other Ambulatory Visit: Payer: Self-pay

## 2019-06-08 DIAGNOSIS — Z20822 Contact with and (suspected) exposure to covid-19: Secondary | ICD-10-CM

## 2019-06-11 LAB — SPECIMEN STATUS REPORT

## 2019-06-11 LAB — NOVEL CORONAVIRUS, NAA: SARS-CoV-2, NAA: NOT DETECTED

## 2019-07-04 ENCOUNTER — Other Ambulatory Visit: Payer: Self-pay

## 2019-07-04 DIAGNOSIS — Z20822 Contact with and (suspected) exposure to covid-19: Secondary | ICD-10-CM

## 2019-07-05 LAB — NOVEL CORONAVIRUS, NAA: SARS-CoV-2, NAA: NOT DETECTED

## 2019-12-31 ENCOUNTER — Ambulatory Visit: Payer: Medicaid Other | Attending: Internal Medicine

## 2019-12-31 ENCOUNTER — Ambulatory Visit: Payer: Self-pay

## 2019-12-31 DIAGNOSIS — Z23 Encounter for immunization: Secondary | ICD-10-CM

## 2019-12-31 NOTE — Progress Notes (Signed)
   Covid-19 Vaccination Clinic  Name:  Anita Knapp    MRN: [id number] DOB: [birth year]  12/31/2019  [name] was observed post Covid-19 immunization for 15 minutes without incident. She was provided with Vaccine Information Sheet and instruction to access the V-Safe system.   [name] was instructed to call 911 with any severe reactions post vaccine: Marland Kitchen Difficulty breathing  . Swelling of face and throat  . A fast heartbeat  . A bad rash all over body  . Dizziness and weakness   Immunizations Administered    Name Date Dose VIS Date Route   Pfizer COVID-19 Vaccine 12/31/2019 11:32 AM 0.3 mL 09/02/2019 Intramuscular   Manufacturer: [employer], [employer]   Lot: [id number]   NDC: [address]-5

## 2020-01-23 ENCOUNTER — Ambulatory Visit: Payer: Medicaid Other | Attending: Internal Medicine

## 2020-01-23 DIAGNOSIS — Z23 Encounter for immunization: Secondary | ICD-10-CM

## 2020-01-23 NOTE — Progress Notes (Signed)
   Covid-19 Vaccination Clinic  Name:  Anita Knapp    MRN: [id number] DOB: 03-23-1996  01/23/2020  Anita Knapp was observed post Covid-19 immunization for 15 minutes without incident. She was provided with Vaccine Information Sheet and instruction to access the V-Safe system.   Anita Knapp was instructed to call 911 with any severe reactions post vaccine: Marland Kitchen Difficulty breathing  . Swelling of face and throat  . A fast heartbeat  . A bad rash all over body  . Dizziness and weakness   Immunizations Administered    Name Date Dose VIS Date Route   Pfizer COVID-19 Vaccine 01/23/2020 12:30 PM 0.3 mL 11/16/2018 Intramuscular   Manufacturer: [employer], [employer]   Lot: [id number]   NDC: [address]-5

## 2021-01-27 ENCOUNTER — Emergency Department (HOSPITAL_COMMUNITY)
Admission: EM | Admit: 2021-01-27 | Discharge: 2021-01-27 | Disposition: A | Discharge status: Home / Self Care | Payer: BC Managed Care – PPO | Attending: Emergency Medicine | Admitting: Emergency Medicine

## 2021-01-27 DIAGNOSIS — Z23 Encounter for immunization: Secondary | ICD-10-CM | POA: Diagnosis not present

## 2021-01-27 DIAGNOSIS — X102XXA Contact with fats and cooking oils, initial encounter: Secondary | ICD-10-CM | POA: Diagnosis not present

## 2021-01-27 DIAGNOSIS — T25231A Burn of second degree of right toe(s) (nail), initial encounter: Secondary | ICD-10-CM | POA: Diagnosis not present

## 2021-01-27 DIAGNOSIS — R000 Tachycardia, unspecified: Secondary | ICD-10-CM | POA: Diagnosis not present

## 2021-01-27 DIAGNOSIS — T310 Burns involving less than 10% of body surface: Secondary | ICD-10-CM | POA: Diagnosis not present

## 2021-01-27 DIAGNOSIS — T24211A Burn of second degree of right thigh, initial encounter: Secondary | ICD-10-CM

## 2021-01-27 DIAGNOSIS — T2122XA Burn of second degree of abdominal wall, initial encounter: Secondary | ICD-10-CM

## 2021-01-27 MED ORDER — HYDROMORPHONE HCL 1 MG/ML IJ SOLN
1.0000 mg | Freq: Once | INTRAMUSCULAR | Status: AC
  Administered 2021-01-27: 1 mg via INTRAVENOUS
  Filled 2021-01-27: qty 1

## 2021-01-27 MED ORDER — ACETAMINOPHEN 325 MG PO TABS: 650.0000 mg | ORAL_TABLET | Freq: Four times a day (QID) | ORAL | 1 refills | Status: AC | PRN

## 2021-01-27 MED ORDER — IBUPROFEN 600 MG PO TABS: 600.0000 mg | ORAL_TABLET | Freq: Four times a day (QID) | ORAL | 0 refills | Status: DC | PRN

## 2021-01-27 MED ORDER — SILVER SULFADIAZINE 1 % EX CREA
TOPICAL_CREAM | Freq: Once | CUTANEOUS | Status: AC
  Filled 2021-01-27: qty 50

## 2021-01-27 MED ORDER — SILVER SULFADIAZINE 1 % EX CREA: 1.0000 "application " | TOPICAL_CREAM | Freq: Two times a day (BID) | CUTANEOUS | 2 refills | Status: AC

## 2021-01-27 MED ORDER — TETANUS-DIPHTH-ACELL PERTUSSIS 5-2.5-18.5 LF-MCG/0.5 IM SUSY
0.5000 mL | PREFILLED_SYRINGE | Freq: Once | INTRAMUSCULAR | Status: AC
  Administered 2021-01-27: 0.5 mL via INTRAMUSCULAR
  Filled 2021-01-27: qty 0.5

## 2021-01-27 MED ORDER — CEPHALEXIN 500 MG PO CAPS: 500.0000 mg | ORAL_CAPSULE | Freq: Two times a day (BID) | ORAL | 0 refills | Status: AC

## 2021-01-27 MED ORDER — OXYCODONE HCL 5 MG PO TABS: 5.0000 mg | ORAL_TABLET | ORAL | 0 refills | Status: AC | PRN
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
## 2021-01-29 DIAGNOSIS — T25231D Burn of second degree of right toe(s) (nail), subsequent encounter: Secondary | ICD-10-CM | POA: Diagnosis not present

## 2021-01-29 DIAGNOSIS — X58XXXD Exposure to other specified factors, subsequent encounter: Secondary | ICD-10-CM | POA: Diagnosis not present

## 2021-01-29 DIAGNOSIS — T2122XD Burn of second degree of abdominal wall, subsequent encounter: Secondary | ICD-10-CM | POA: Diagnosis not present

## 2021-01-29 DIAGNOSIS — T25291D Burn of second degree of multiple sites of right ankle and foot, subsequent encounter: Secondary | ICD-10-CM | POA: Diagnosis not present

## 2021-01-29 DIAGNOSIS — X102XXD Contact with fats and cooking oils, subsequent encounter: Secondary | ICD-10-CM | POA: Diagnosis not present

## 2021-01-29 DIAGNOSIS — T24211D Burn of second degree of right thigh, subsequent encounter: Secondary | ICD-10-CM | POA: Diagnosis not present

## 2021-01-29 DIAGNOSIS — T310 Burns involving less than 10% of body surface: Secondary | ICD-10-CM | POA: Diagnosis not present

## 2021-02-12 DIAGNOSIS — T24011D Burn of unspecified degree of right thigh, subsequent encounter: Secondary | ICD-10-CM | POA: Diagnosis not present

## 2021-02-12 DIAGNOSIS — X19XXXD Contact with other heat and hot substances, subsequent encounter: Secondary | ICD-10-CM | POA: Diagnosis not present

## 2021-02-12 DIAGNOSIS — X19XXXA Contact with other heat and hot substances, initial encounter: Secondary | ICD-10-CM | POA: Diagnosis not present

## 2021-02-12 DIAGNOSIS — T310 Burns involving less than 10% of body surface: Secondary | ICD-10-CM | POA: Diagnosis not present

## 2021-02-19 DIAGNOSIS — T310 Burns involving less than 10% of body surface: Secondary | ICD-10-CM | POA: Diagnosis not present

## 2021-02-20 DIAGNOSIS — E669 Obesity, unspecified: Secondary | ICD-10-CM | POA: Diagnosis not present

## 2021-02-20 DIAGNOSIS — Z01818 Encounter for other preprocedural examination: Secondary | ICD-10-CM | POA: Diagnosis not present

## 2021-02-20 DIAGNOSIS — X102XXA Contact with fats and cooking oils, initial encounter: Secondary | ICD-10-CM | POA: Diagnosis not present

## 2021-02-20 DIAGNOSIS — T24011A Burn of unspecified degree of right thigh, initial encounter: Secondary | ICD-10-CM | POA: Diagnosis not present

## 2021-02-20 DIAGNOSIS — Z6841 Body Mass Index (BMI) 40.0 and over, adult: Secondary | ICD-10-CM | POA: Diagnosis not present

## 2021-02-20 DIAGNOSIS — T310 Burns involving less than 10% of body surface: Secondary | ICD-10-CM | POA: Diagnosis not present

## 2021-02-28 DIAGNOSIS — X102XXA Contact with fats and cooking oils, initial encounter: Secondary | ICD-10-CM | POA: Diagnosis not present

## 2021-02-28 DIAGNOSIS — T25331A Burn of third degree of right toe(s) (nail), initial encounter: Secondary | ICD-10-CM | POA: Diagnosis not present

## 2021-02-28 DIAGNOSIS — T24001A Burn of unspecified degree of unspecified site of right lower limb, except ankle and foot, initial encounter: Secondary | ICD-10-CM | POA: Diagnosis not present

## 2021-02-28 DIAGNOSIS — T2102XA Burn of unspecified degree of abdominal wall, initial encounter: Secondary | ICD-10-CM | POA: Diagnosis not present

## 2021-02-28 DIAGNOSIS — T8189XA Other complications of procedures, not elsewhere classified, initial encounter: Secondary | ICD-10-CM | POA: Diagnosis not present

## 2021-02-28 DIAGNOSIS — T310 Burns involving less than 10% of body surface: Secondary | ICD-10-CM | POA: Diagnosis not present

## 2021-02-28 DIAGNOSIS — X088XXA Exposure to other specified smoke, fire and flames, initial encounter: Secondary | ICD-10-CM | POA: Diagnosis not present

## 2021-03-01 DIAGNOSIS — T8189XA Other complications of procedures, not elsewhere classified, initial encounter: Secondary | ICD-10-CM | POA: Diagnosis not present

## 2021-03-02 DIAGNOSIS — T8189XA Other complications of procedures, not elsewhere classified, initial encounter: Secondary | ICD-10-CM | POA: Diagnosis not present

## 2021-03-03 ENCOUNTER — Other Ambulatory Visit: Payer: Self-pay

## 2021-03-03 ENCOUNTER — Emergency Department (HOSPITAL_COMMUNITY)
Admission: EM | Admit: 2021-03-03 | Discharge: 2021-03-03 | Disposition: A | Discharge status: Home / Self Care | Payer: BC Managed Care – PPO | Attending: Emergency Medicine | Admitting: Emergency Medicine

## 2021-03-03 DIAGNOSIS — T8189XA Other complications of procedures, not elsewhere classified, initial encounter: Secondary | ICD-10-CM | POA: Diagnosis not present

## 2021-03-03 DIAGNOSIS — Z4689 Encounter for fitting and adjustment of other specified devices: Secondary | ICD-10-CM

## 2021-03-03 DIAGNOSIS — Z4801 Encounter for change or removal of surgical wound dressing: Secondary | ICD-10-CM | POA: Insufficient documentation

## 2021-03-03 MED ORDER — OXYCODONE-ACETAMINOPHEN 5-325 MG PO TABS
2.0000 | ORAL_TABLET | Freq: Once | ORAL | Status: AC
  Administered 2021-03-03: 2 via ORAL
  Filled 2021-03-03: qty 2

## 2021-03-03 MED ORDER — IBUPROFEN 200 MG PO TABS
600.0000 mg | ORAL_TABLET | Freq: Once | ORAL | Status: AC
  Administered 2021-03-03: 600 mg via ORAL
  Filled 2021-03-03: qty 3
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
## 2021-03-04 DIAGNOSIS — T8189XA Other complications of procedures, not elsewhere classified, initial encounter: Secondary | ICD-10-CM | POA: Diagnosis not present

## 2021-03-05 DIAGNOSIS — T310 Burns involving less than 10% of body surface: Secondary | ICD-10-CM | POA: Diagnosis not present

## 2021-03-05 DIAGNOSIS — T8189XA Other complications of procedures, not elsewhere classified, initial encounter: Secondary | ICD-10-CM | POA: Diagnosis not present

## 2021-03-05 DIAGNOSIS — X088XXD Exposure to other specified smoke, fire and flames, subsequent encounter: Secondary | ICD-10-CM | POA: Diagnosis not present

## 2021-03-05 DIAGNOSIS — T25321D Burn of third degree of right foot, subsequent encounter: Secondary | ICD-10-CM | POA: Diagnosis not present

## 2021-03-06 DIAGNOSIS — T8189XA Other complications of procedures, not elsewhere classified, initial encounter: Secondary | ICD-10-CM | POA: Diagnosis not present

## 2021-03-07 DIAGNOSIS — T8189XA Other complications of procedures, not elsewhere classified, initial encounter: Secondary | ICD-10-CM | POA: Diagnosis not present

## 2021-03-08 DIAGNOSIS — T8189XA Other complications of procedures, not elsewhere classified, initial encounter: Secondary | ICD-10-CM | POA: Diagnosis not present

## 2021-03-09 DIAGNOSIS — T8189XA Other complications of procedures, not elsewhere classified, initial encounter: Secondary | ICD-10-CM | POA: Diagnosis not present

## 2021-03-10 DIAGNOSIS — T8189XA Other complications of procedures, not elsewhere classified, initial encounter: Secondary | ICD-10-CM | POA: Diagnosis not present

## 2021-03-11 DIAGNOSIS — T8189XA Other complications of procedures, not elsewhere classified, initial encounter: Secondary | ICD-10-CM | POA: Diagnosis not present

## 2021-03-12 DIAGNOSIS — T8189XA Other complications of procedures, not elsewhere classified, initial encounter: Secondary | ICD-10-CM | POA: Diagnosis not present

## 2021-03-13 DIAGNOSIS — T8189XA Other complications of procedures, not elsewhere classified, initial encounter: Secondary | ICD-10-CM | POA: Diagnosis not present

## 2021-03-14 DIAGNOSIS — T8189XA Other complications of procedures, not elsewhere classified, initial encounter: Secondary | ICD-10-CM | POA: Diagnosis not present

## 2021-03-15 DIAGNOSIS — T8189XA Other complications of procedures, not elsewhere classified, initial encounter: Secondary | ICD-10-CM | POA: Diagnosis not present

## 2021-03-16 DIAGNOSIS — T8189XA Other complications of procedures, not elsewhere classified, initial encounter: Secondary | ICD-10-CM | POA: Diagnosis not present

## 2021-03-17 DIAGNOSIS — T8189XA Other complications of procedures, not elsewhere classified, initial encounter: Secondary | ICD-10-CM | POA: Diagnosis not present

## 2021-03-18 DIAGNOSIS — T8189XA Other complications of procedures, not elsewhere classified, initial encounter: Secondary | ICD-10-CM | POA: Diagnosis not present

## 2021-06-20 ENCOUNTER — Encounter: Payer: Self-pay | Admitting: Emergency Medicine

## 2021-06-20 ENCOUNTER — Ambulatory Visit
Admission: EM | Admit: 2021-06-20 | Discharge: 2021-06-20 | Disposition: A | Discharge status: Home / Self Care | Payer: BC Managed Care – PPO | Attending: Internal Medicine | Admitting: Internal Medicine

## 2021-06-20 ENCOUNTER — Other Ambulatory Visit: Payer: Self-pay

## 2021-06-20 DIAGNOSIS — K0889 Other specified disorders of teeth and supporting structures: Secondary | ICD-10-CM

## 2021-06-20 DIAGNOSIS — K047 Periapical abscess without sinus: Secondary | ICD-10-CM | POA: Diagnosis not present

## 2021-06-20 MED ORDER — IBUPROFEN 800 MG PO TABS: 800.0000 mg | ORAL_TABLET | Freq: Three times a day (TID) | ORAL | 0 refills | Status: DC | PRN

## 2021-06-20 MED ORDER — AMOXICILLIN 875 MG PO TABS: 875.0000 mg | ORAL_TABLET | Freq: Two times a day (BID) | ORAL | 0 refills | Status: AC

## 2021-06-20 NOTE — ED Triage Notes (Signed)
Patient c/o that her bottom wisdom teeth have been bothering her for over a month now.  There is some swelling and causing patient to have a "lisps".  Patient has been taken Ibuprofen for pain.

## 2021-06-20 NOTE — Discharge Instructions (Addendum)
Please follow-up with dentist soon as possible for further evaluation and management.

## 2021-06-20 NOTE — ED Provider Notes (Signed)
[facility]    CSN: [id number] Arrival date & time: 06/20/21  1139      History   Chief Complaint Chief Complaint  Patient presents with   Dental Pain    HPI Anita Knapp is a 25 y.o. female.   Patient presents with bilateral lower dental pain that has been present for approximately 1 month.  Patient reports that she has had intermittent dental pain in the same location for multiple years.  Was recently seen in 2018 at the ED for similar dental pain.  Was prescribed chlorhexidine solution and ibuprofen for pain and was advised to follow-up with a dentist.  Patient has not yet followed up with dentist and does not currently have appointment for current dental pain.  Denies any purulent drainage from mouth or any known fevers.  Denies any injury to the mouth.  Denies any chest pain or shortness of breath.   Dental Pain  History reviewed. No pertinent past medical history.  There are no problems to display for this patient.   History reviewed. No pertinent surgical history.  OB History   No obstetric history on file.      Home Medications    Prior to Admission medications   Medication Sig Start Date End Date Taking? Authorizing Provider  amoxicillin (AMOXIL) 875 MG tablet Take 1 tablet (875 mg total) by mouth 2 (two) times daily for 10 days. 06/20/21 06/30/21 Yes Lance Muss, FNP  ibuprofen (ADVIL) 800 MG tablet Take 1 tablet (800 mg total) by mouth 3 (three) times daily as needed for mild pain or moderate pain. 06/20/21  Yes Lance Muss, FNP  gabapentin (NEURONTIN) 300 MG capsule Take 1 capsule by mouth in the morning, at noon, and at bedtime. 02/19/21 03/21/21  [provider]  silver sulfADIAZINE (SILVADENE) 1 % cream Apply topically daily. 02/12/21   [provider]    Family History No family history on file.  Social History Social History   Tobacco Use   Smoking status: Never   Smokeless tobacco: Never  Substance Use Topics    Alcohol use: No   Drug use: No     Allergies   Patient has no known allergies.   Review of Systems Review of Systems Per HPI  Physical Exam Triage Vital Signs ED Triage Vitals  Enc Vitals Group     BP 06/20/21 1310 126/84     Pulse Rate 06/20/21 1310 71     Resp 06/20/21 1310 18     Temp 06/20/21 1310 98.2 F (36.8 C)     Temp Source 06/20/21 1310 Oral     SpO2 06/20/21 1310 97 %     Weight 06/20/21 1312 (!) 380 lb (172.4 kg)     Height 06/20/21 1312 5\' 6"  (1.676 m)     Head Circumference --      Peak Flow --      Pain Score 06/20/21 1311 7     Pain Loc --      Pain Edu? --      Excl. in GC? --    No data found.  Updated Vital Signs BP 126/84 (BP Location: Left Arm)   Pulse 71   Temp 98.2 F (36.8 C) (Oral)   Resp 18   Ht 5\' 6"  (1.676 m)   Wt (!) 380 lb (172.4 kg)   LMP 06/19/2021   SpO2 97%   BMI 61.33 kg/m   Visual Acuity Right Eye Distance:   Left Eye  Distance:   Bilateral Distance:    Right Eye Near:   Left Eye Near:    Bilateral Near:     Physical Exam Constitutional:      Appearance: Normal appearance.  HENT:     Head: Normocephalic and atraumatic.     Mouth/Throat:     Lips: Pink.     Mouth: Mucous membranes are moist.     Dentition: Abnormal dentition. Dental tenderness, gingival swelling and dental abscesses present.     Pharynx: Oropharynx is clear. No posterior oropharyngeal erythema.     Comments: Gingival swelling noted to bilateral back teeth.  Appears to have impacted wisdom teeth to right lower side.  There also appears to be start of very small pinpoint abscess to left lower region of teeth. Eyes:     Extraocular Movements: Extraocular movements intact.     Conjunctiva/sclera: Conjunctivae normal.  Cardiovascular:     Rate and Rhythm: Normal rate and regular rhythm.     Pulses: Normal pulses.     Heart sounds: Normal heart sounds.  Pulmonary:     Effort: Pulmonary effort is normal.  Neurological:     General: No focal  deficit present.     Mental Status: She is alert and oriented to person, place, and time. Mental status is at baseline.  Psychiatric:        Mood and Affect: Mood normal.        Behavior: Behavior normal.        Thought Content: Thought content normal.        Judgment: Judgment normal.     UC Treatments / Results  Labs (all labs ordered are listed, but only abnormal results are displayed) Labs Reviewed - No data to display  EKG   Radiology No results found.  Procedures Procedures (including critical care time)  Medications Ordered in UC Medications - No data to display  Initial Impression / Assessment and Plan / UC Course  I have reviewed the triage vital signs and the nursing notes.  Pertinent labs & imaging results that were available during my care of the patient were reviewed by me and considered in my medical decision making (see chart for details).     Will treat possible dental infection with amoxicillin x10 days.  Ibuprofen to decrease pain and inflammation.  Advised patient of the importance of following up with dentist for further evaluation and management as this will be a recurrent problem if it does not get fixed.  No red flags on exam. Discussed strict return precautions. Patient verbalized understanding and is agreeable with plan.  Final Clinical Impressions(s) / UC Diagnoses   Final diagnoses:  Dental infection  Pain, dental     Discharge Instructions      Please follow-up with dentist soon as possible for further evaluation and management.     ED Prescriptions     Medication Sig Dispense Auth. Provider   amoxicillin (AMOXIL) 875 MG tablet Take 1 tablet (875 mg total) by mouth 2 (two) times daily for 10 days. 20 tablet Lance Muss, FNP   ibuprofen (ADVIL) 800 MG tablet Take 1 tablet (800 mg total) by mouth 3 (three) times daily as needed for mild pain or moderate pain. 21 tablet Lance Muss, FNP      PDMP not reviewed this  encounter.   Lance Muss, FNP 06/20/21 [phone]

## 2021-07-04 ENCOUNTER — Encounter (HOSPITAL_COMMUNITY): Payer: Self-pay | Admitting: Emergency Medicine

## 2021-07-04 ENCOUNTER — Emergency Department (HOSPITAL_COMMUNITY)
Admission: EM | Admit: 2021-07-04 | Discharge: 2021-07-05 | Disposition: A | Discharge status: Home / Self Care | Payer: BC Managed Care – PPO | Attending: Emergency Medicine | Admitting: Emergency Medicine

## 2021-07-04 DIAGNOSIS — Z5321 Procedure and treatment not carried out due to patient leaving prior to being seen by health care provider: Secondary | ICD-10-CM | POA: Insufficient documentation

## 2021-07-04 DIAGNOSIS — N939 Abnormal uterine and vaginal bleeding, unspecified: Secondary | ICD-10-CM | POA: Insufficient documentation

## 2021-07-04 DIAGNOSIS — N898 Other specified noninflammatory disorders of vagina: Secondary | ICD-10-CM | POA: Insufficient documentation

## 2021-07-04 DIAGNOSIS — R102 Pelvic and perineal pain: Secondary | ICD-10-CM | POA: Diagnosis not present

## 2021-07-04 LAB — CBC WITH DIFFERENTIAL/PLATELET
Abs Immature Granulocytes: 0.02 10*3/uL (ref 0.00–0.07)
Basophils Absolute: 0 10*3/uL (ref 0.0–0.1)
Basophils Relative: 0 %
Eosinophils Absolute: 0.1 10*3/uL (ref 0.0–0.5)
Eosinophils Relative: 2 %
HCT: 40 % (ref 36.0–46.0)
Hemoglobin: 12.7 g/dL (ref 12.0–15.0)
Immature Granulocytes: 0 %
Lymphocytes Relative: 43 %
Lymphs Abs: 2.9 10*3/uL (ref 0.7–4.0)
MCH: 28.2 pg (ref 26.0–34.0)
MCHC: 31.8 g/dL (ref 30.0–36.0)
MCV: 88.9 fL (ref 80.0–100.0)
Monocytes Absolute: 0.4 10*3/uL (ref 0.1–1.0)
Monocytes Relative: 6 %
Neutro Abs: 3.3 10*3/uL (ref 1.7–7.7)
Neutrophils Relative %: 49 %
Platelets: 330 10*3/uL (ref 150–400)
RBC: 4.5 MIL/uL (ref 3.87–5.11)
RDW: 15.2 % (ref 11.5–15.5)
WBC: 6.7 10*3/uL (ref 4.0–10.5)
nRBC: 0 % (ref 0.0–0.2)

## 2021-07-04 LAB — BASIC METABOLIC PANEL
Anion gap: 7 (ref 5–15)
BUN: 11 mg/dL (ref 6–20)
CO2: 27 mmol/L (ref 22–32)
Calcium: 8.9 mg/dL (ref 8.9–10.3)
Chloride: 103 mmol/L (ref 98–111)
Creatinine, Ser: 0.73 mg/dL (ref 0.44–1.00)
GFR, Estimated: >60 mL/min (ref >60)
Glucose, Bld: 98 mg/dL (ref 70–99)
Potassium: 3.7 mmol/L (ref 3.5–5.1)
Sodium: 137 mmol/L (ref 135–145)

## 2021-07-04 LAB — I-STAT BETA HCG BLOOD, ED (MC, WL, AP ONLY): I-stat hCG, quantitative: <5 m[IU]/mL (ref <5)

## 2021-07-04 NOTE — ED Notes (Signed)
Pt provided with specimen cup and made aware of need for urine specimen  

## 2021-07-04 NOTE — ED Triage Notes (Signed)
Patient here from home reporting vag bleeding and pelvic pain that started on 9/26 with  clots and cramps. States that she has appt on Nov 1st with OB. Reports same in past and place on birth control with some regulation.

## 2021-07-04 NOTE — ED Provider Notes (Signed)
Emergency Medicine Provider Triage Evaluation Note  Anita Knapp , a 25 y.o. female  was evaluated in triage.  Pt complains of vaginal bleeding and pain. Started LMP on 9/26 and has not stopped bleeding. Heavy clots and cramps. Had a similar instance 5 years ago. She was placed on birth control which regulated her menstrual cycles. Has gyn appt Nov 1.   Review of Systems  Positive: Vaginal bleeding Negative: Lightheadedness  Physical Exam  BP (!) 144/93 (BP Location: Left Wrist)   Pulse (!) 102   Temp 98.4 F (36.9 C) (Oral)   Resp 18   LMP 06/19/2021   SpO2 100%  Gen:   Awake, no distress   Resp:  Normal effort  MSK:   Moves extremities without difficulty  Other:  No ttp abd  Medical Decision Making  Medically screening exam initiated at 4:01 PM.  Appropriate orders placed.  Riyah Bardon Clinch was informed that the remainder of the evaluation will be completed by another provider, this initial triage assessment does not replace that evaluation, and the importance of remaining in the ED until their evaluation is complete.  Labs initiated.   Arthor Captain, PA-C 07/04/21 1603    Charlynne Pander, MD 07/04/21 [phone]

## 2021-07-05 ENCOUNTER — Emergency Department (HOSPITAL_BASED_OUTPATIENT_CLINIC_OR_DEPARTMENT_OTHER)
Admission: EM | Admit: 2021-07-05 | Discharge: 2021-07-05 | Disposition: A | Discharge status: Home / Self Care | Payer: BC Managed Care – PPO | Source: Home / Self Care | Attending: Emergency Medicine | Admitting: Emergency Medicine

## 2021-07-05 ENCOUNTER — Encounter (HOSPITAL_BASED_OUTPATIENT_CLINIC_OR_DEPARTMENT_OTHER): Payer: Self-pay | Admitting: *Deleted

## 2021-07-05 ENCOUNTER — Other Ambulatory Visit: Payer: Self-pay

## 2021-07-05 DIAGNOSIS — N939 Abnormal uterine and vaginal bleeding, unspecified: Secondary | ICD-10-CM | POA: Diagnosis not present

## 2021-07-05 DIAGNOSIS — N898 Other specified noninflammatory disorders of vagina: Secondary | ICD-10-CM | POA: Insufficient documentation

## 2021-07-05 DIAGNOSIS — N938 Other specified abnormal uterine and vaginal bleeding: Secondary | ICD-10-CM | POA: Insufficient documentation

## 2021-07-05 LAB — PREGNANCY, URINE: Preg Test, Ur: NEGATIVE

## 2021-07-05 LAB — CBC
HCT: 36.2 % (ref 36.0–46.0)
Hemoglobin: 11.7 g/dL — ABNORMAL LOW (ref 12.0–15.0)
MCH: 28.1 pg (ref 26.0–34.0)
MCHC: 32.3 g/dL (ref 30.0–36.0)
MCV: 86.8 fL (ref 80.0–100.0)
Platelets: 319 10*3/uL (ref 150–400)
RBC: 4.17 MIL/uL (ref 3.87–5.11)
RDW: 15.3 % (ref 11.5–15.5)
WBC: 8.7 10*3/uL (ref 4.0–10.5)
nRBC: 0 % (ref 0.0–0.2)

## 2021-07-05 LAB — URINALYSIS, ROUTINE W REFLEX MICROSCOPIC
Bilirubin Urine: NEGATIVE
Glucose, UA: NEGATIVE mg/dL
Ketones, ur: NEGATIVE mg/dL
Leukocytes,Ua: NEGATIVE
Nitrite: NEGATIVE
Protein, ur: NEGATIVE mg/dL
Specific Gravity, Urine: >1.03 — ABNORMAL HIGH (ref 1.005–1.030)
pH: 6 (ref 5.0–8.0)

## 2021-07-05 LAB — URINALYSIS, MICROSCOPIC (REFLEX)

## 2021-07-05 LAB — WET PREP, GENITAL
Clue Cells Wet Prep HPF POC: NONE SEEN
Sperm: NONE SEEN
Trich, Wet Prep: NONE SEEN
WBC, Wet Prep HPF POC: NONE SEEN
Yeast Wet Prep HPF POC: NONE SEEN

## 2021-07-05 MED ORDER — TRANEXAMIC ACID 650 MG PO TABS: 1300.0000 mg | ORAL_TABLET | Freq: Three times a day (TID) | ORAL | 0 refills | Status: AC

## 2021-07-05 NOTE — ED Notes (Addendum)
First contact with patient. Patient arrived via triage from home with complaints of vaginal bleeding x September- Patient has been seen for this same complaint but states it has not stopped. Patient is not on birth control and is waiting to be seen by OBGYN. Pt is A&OX 4. Respirations even/unlabored. Patient changed into gown and placed on monitor and call light within reach. Patient updated on plan of care. Will continue to monitor patient.   2021: Pelvic completed -specimens sent to lab. Blood collected and sent to lab.

## 2021-07-05 NOTE — ED Provider Notes (Signed)
[facility] EMERGENCY DEPARTMENT Provider Note   CSN: [id number] Arrival date & time: 07/05/21  1807     History Chief Complaint  Patient presents with   Vaginal Discharge    Anita Knapp is a 25 y.o. female with no significant past medical history who presents for evaluation of vaginal bleeding.  LMP 9/26.  Has not stopped bleeding since then.  She intermittently has cramps.  She had similar instance 5 years ago and had to be placed on birth control.  This regulate her menstrual cycles however she subsequently went off the birth control.  She changes a pad or a tampon approximately every 3 hours.  She has no lightheadedness or dizziness.  She has no current pain.  She has appointment to establish care with [facility] in November 1.  No headache, syncope, chest pain, shortness of breath, abdominal pain, pelvic pain.  She has never had a pelvic exam and is not currently sexually active.  Denies additional aggravating or relieving factors.  History obtained from patient and past medical records.  No interpreter used  HPI     History reviewed. No pertinent past medical history.  There are no problems to display for this patient.   History reviewed. No pertinent surgical history.   OB History   No obstetric history on file.     No family history on file.  Social History   Tobacco Use   Smoking status: Never   Smokeless tobacco: Never  Vaping Use   Vaping Use: Never used  Substance Use Topics   Alcohol use: No   Drug use: No    Home Medications Prior to Admission medications   Medication Sig Start Date End Date Taking? Authorizing Provider  ibuprofen (ADVIL) 800 MG tablet Take 1 tablet (800 mg total) by mouth 3 (three) times daily as needed for mild pain or moderate pain. 06/20/21  Yes Lance Muss, FNP  tranexamic acid (LYSTEDA) 650 MG TABS tablet Take 2 tablets (1,300 mg total) by mouth 3 (three) times daily for 5 days. 07/05/21 07/10/21 Yes Karin Griffith,  Ramata Strothman A, PA-C  gabapentin (NEURONTIN) 300 MG capsule Take 1 capsule by mouth in the morning, at noon, and at bedtime. 02/19/21 03/21/21  [provider]  silver sulfADIAZINE (SILVADENE) 1 % cream Apply topically daily. 02/12/21   [provider]    Allergies    Patient has no known allergies.  Review of Systems   Review of Systems  Constitutional: Negative.   HENT: Negative.    Respiratory: Negative.    Cardiovascular: Negative.   Gastrointestinal: Negative.   Genitourinary:  Positive for menstrual problem and vaginal bleeding. Negative for decreased urine volume, difficulty urinating, dysuria, enuresis, flank pain, frequency, hematuria, pelvic pain, urgency, vaginal discharge and vaginal pain.  Musculoskeletal: Negative.   Neurological: Negative.   All other systems reviewed and are negative.  Physical Exam Updated Vital Signs BP (!) 135/94 (BP Location: Right Arm)   Pulse 88   Temp 98.2 F (36.8 C) (Oral)   Resp 18   Ht 5\' 6"  (1.676 m)   Wt (!) 172.4 kg   LMP 06/19/2021   SpO2 100%   BMI 61.35 kg/m   Physical Exam Vitals and nursing note reviewed. Exam conducted with a chaperone present.  Constitutional:      General: She is not in acute distress.    Appearance: She is well-developed. She is obese. She is not ill-appearing, toxic-appearing or diaphoretic.  HENT:  Head: Normocephalic and atraumatic.     Nose: Nose normal.     Mouth/Throat:     Mouth: Mucous membranes are moist.  Eyes:     Pupils: Pupils are equal, round, and reactive to light.  Cardiovascular:     Rate and Rhythm: Normal rate.     Pulses: Normal pulses.     Heart sounds: Normal heart sounds.  Pulmonary:     Effort: Pulmonary effort is normal. No respiratory distress.     Breath sounds: Normal breath sounds.  Abdominal:     General: Bowel sounds are normal. There is no distension.     Palpations: Abdomen is soft.     Tenderness: There is no abdominal tenderness. There is no  right CVA tenderness, left CVA tenderness, guarding or rebound.     Hernia: No hernia is present.  Genitourinary:    General: Normal vulva.     Vagina: No vaginal discharge.     Comments: Normal appearing external female genitalia without rashes or lesions, normal vaginal epithelium. Normal appearing cervix without discharge or petechiae. Cervical os is closed. There is mild bleeding noted at the os.No odor. Bimanual: No CMT,  nontender.  No palpable adnexal masses or tenderness. Uterus midline and not fixed. Rectovaginal exam was deferred.  No cystocele or rectocele noted. No pelvic lymphadenopathy noted. Wet prep was obtained.  Cultures for gonorrhea and chlamydia collected. Exam performed with chaperone in room.   Musculoskeletal:        General: Normal range of motion.     Cervical back: Normal range of motion.     Right lower leg: No edema.     Left lower leg: No edema.  Skin:    General: Skin is warm and dry.     Capillary Refill: Capillary refill takes less than 2 seconds.  Neurological:     General: No focal deficit present.     Mental Status: She is alert and oriented to person, place, and time.  Psychiatric:        Mood and Affect: Mood normal.    ED Results / Procedures / Treatments   Labs (all labs ordered are listed, but only abnormal results are displayed) Labs Reviewed  URINALYSIS, ROUTINE W REFLEX MICROSCOPIC - Abnormal; Notable for the following components:      Result Value   Specific Gravity, Urine >1.030 (*)    Hgb urine dipstick MODERATE (*)    All other components within normal limits  URINALYSIS, MICROSCOPIC (REFLEX) - Abnormal; Notable for the following components:   Bacteria, UA RARE (*)    All other components within normal limits  CBC - Abnormal; Notable for the following components:   Hemoglobin 11.7 (*)    All other components within normal limits  WET PREP, GENITAL  PREGNANCY, URINE  RPR  HIV ANTIBODY (ROUTINE TESTING W REFLEX)  GC/CHLAMYDIA  PROBE AMP ([place]) NOT AT [facility]    EKG None  Radiology No results found.  Procedures Procedures   Medications Ordered in ED Medications - No data to display  ED Course  I have reviewed the triage vital signs and the nursing notes.  Pertinent labs & imaging results that were available during my care of the patient were reviewed by me and considered in my medical decision making (see chart for details).  Here with vaginal bleeding over the last 3 weeks.  She is afebrile, nonseptic, not ill-appearing.  She is stable vital signs.  Initially seen and had MSE exam performed yesterday  at [facility].  No evidence of anemia at that time.  Pelvic exam here with mild blood at os.  No CMT or adnexal tenderness.  She has no abdominal pain or pelvic pain at this time.  We will repeat CBC.  Has had something similar previously and needed OCP.  Patient reassessed.  Discussed labs and imaging.  No obvious infectious process.  Hemoglobin with slight drop from yesterday.  She continues to be hemodynamically stable, no lightheadedness or dizziness.  Discussed symptomatic treatment.  We will start her on Lysteda.  Discussed risk versus benefit include risk of blood clots.  She voices understanding.  We will have her contact OB/GYN to see if she can get a sooner appointment.  She is agreeable.  On repeat exam patient does not have a surgical abdomin and there are no peritoneal signs.  No indication of appendicitis, obstruction, TOA, torsion or ectopic pregnancy.     The patient has been appropriately medically screened and/or stabilized in the ED. I have low suspicion for any other emergent medical condition which would require further screening, evaluation or treatment in the ED or require inpatient management.  Patient is hemodynamically stable and in no acute distress.  Patient able to ambulate in department prior to ED.  Evaluation does not show acute pathology that would require ongoing or additional  emergent interventions while in the emergency department or further inpatient treatment.  I have discussed the diagnosis with the patient and answered all questions.  Pain is been managed while in the emergency department and patient has no further complaints prior to discharge.  Patient is comfortable with plan discussed in room and is stable for discharge at this time.  I have discussed strict return precautions for returning to the emergency department.  Patient was encouraged to follow-up with PCP/specialist refer to at discharge.     MDM Rules/Calculators/A&P                            Final Clinical Impression(s) / ED Diagnoses Final diagnoses:  Vaginal bleeding    Rx / DC Orders ED Discharge Orders          Ordered    tranexamic acid (LYSTEDA) 650 MG TABS tablet  3 times daily        07/05/21 2102             Evolette Pendell A, PA-C 07/05/21 2110    Virgina Norfolk, DO 07/05/21 2256

## 2021-07-05 NOTE — ED Notes (Signed)
No acute distress noted upon this RN's departure of patient. Verified discharge paperwork with name and DOB. Vital signs stable. Patient taken to checkout window. Discharge paperwork discussed with patient. No further questions voiced upon discharge.  ° °

## 2021-07-05 NOTE — Discharge Instructions (Addendum)
I started on a medication to help with your vaginal bleeding.  As discussed this does carry risk of increased blood clots.  If you develop any chest pain, shortness of breath, calf pain or swelling please stop taking his medication and present to the emergency department.  If your bleeding worsens, you become lightheaded, dizzy, change a pad in less than 2 hours consistently please aggravation as well  Call OB/GYN to let them know you were seen here on Monday.  See if they can see you sooner

## 2021-07-05 NOTE — ED Triage Notes (Signed)
Vaginal discharge since September.

## 2021-07-06 LAB — RPR: RPR Ser Ql: NONREACTIVE

## 2021-07-06 LAB — HIV ANTIBODY (ROUTINE TESTING W REFLEX): HIV Screen 4th Generation wRfx: NONREACTIVE

## 2021-07-08 LAB — GC/CHLAMYDIA PROBE AMP (~~LOC~~) NOT AT ARMC
Chlamydia: NEGATIVE
Comment: NEGATIVE
Comment: NORMAL
Neisseria Gonorrhea: NEGATIVE

## 2021-07-23 DIAGNOSIS — N939 Abnormal uterine and vaginal bleeding, unspecified: Secondary | ICD-10-CM | POA: Diagnosis not present

## 2021-07-24 ENCOUNTER — Other Ambulatory Visit: Payer: Self-pay | Admitting: Nurse Practitioner

## 2021-07-24 DIAGNOSIS — N939 Abnormal uterine and vaginal bleeding, unspecified: Secondary | ICD-10-CM

## 2021-08-12 ENCOUNTER — Ambulatory Visit
Admission: RE | Admit: 2021-08-12 | Discharge: 2021-08-12 | Disposition: A | Discharge status: Home / Self Care | Payer: BC Managed Care – PPO | Source: Ambulatory Visit | Attending: Nurse Practitioner | Admitting: Nurse Practitioner

## 2021-08-12 DIAGNOSIS — N939 Abnormal uterine and vaginal bleeding, unspecified: Secondary | ICD-10-CM

## 2021-09-13 DIAGNOSIS — D649 Anemia, unspecified: Secondary | ICD-10-CM | POA: Diagnosis not present

## 2021-09-13 DIAGNOSIS — Z13220 Encounter for screening for lipoid disorders: Secondary | ICD-10-CM | POA: Diagnosis not present

## 2021-09-13 DIAGNOSIS — Z23 Encounter for immunization: Secondary | ICD-10-CM | POA: Diagnosis not present

## 2021-09-13 DIAGNOSIS — Z0000 Encounter for general adult medical examination without abnormal findings: Secondary | ICD-10-CM | POA: Diagnosis not present

## 2021-11-18 DIAGNOSIS — Z0289 Encounter for other administrative examinations: Secondary | ICD-10-CM

## 2021-11-26 ENCOUNTER — Ambulatory Visit (INDEPENDENT_AMBULATORY_CARE_PROVIDER_SITE_OTHER): Payer: BC Managed Care – PPO | Admitting: Bariatrics

## 2021-11-26 ENCOUNTER — Encounter (INDEPENDENT_AMBULATORY_CARE_PROVIDER_SITE_OTHER): Payer: Self-pay | Admitting: Bariatrics

## 2021-11-26 ENCOUNTER — Other Ambulatory Visit: Payer: Self-pay

## 2021-11-26 VITALS — BP 132/82 | HR 87 | Temp 98.1°F | Ht 65.0 in | Wt 389.0 lb

## 2021-11-26 DIAGNOSIS — R7309 Other abnormal glucose: Secondary | ICD-10-CM

## 2021-11-26 DIAGNOSIS — Z1331 Encounter for screening for depression: Secondary | ICD-10-CM | POA: Diagnosis not present

## 2021-11-26 DIAGNOSIS — Z6841 Body Mass Index (BMI) 40.0 and over, adult: Secondary | ICD-10-CM | POA: Diagnosis not present

## 2021-11-26 DIAGNOSIS — R5383 Other fatigue: Secondary | ICD-10-CM | POA: Diagnosis not present

## 2021-11-26 DIAGNOSIS — E559 Vitamin D deficiency, unspecified: Secondary | ICD-10-CM | POA: Diagnosis not present

## 2021-11-26 DIAGNOSIS — E668 Other obesity: Secondary | ICD-10-CM

## 2021-11-26 DIAGNOSIS — D649 Anemia, unspecified: Secondary | ICD-10-CM | POA: Diagnosis not present

## 2021-11-26 DIAGNOSIS — R0602 Shortness of breath: Secondary | ICD-10-CM

## 2021-11-26 DIAGNOSIS — N939 Abnormal uterine and vaginal bleeding, unspecified: Secondary | ICD-10-CM | POA: Diagnosis not present

## 2021-11-26 NOTE — Progress Notes (Signed)
? ? ? ?Chief Complaint:  ? ?OBESITY ?Anita Knapp (MR# [id number]) is a 26 y.o. female who presents for evaluation and treatment of obesity and related comorbidities. Current BMI is Body mass index is 64.73 kg/m?Anita Knapp has been struggling with her weight for many years and has been unsuccessful in either losing weight, maintaining weight loss, or reaching her healthy weight goal. ? ?Anita Knapp states that she likes to cook but notes work hours as an issues. She skips breakfast most days.  ? ?Anita Knapp is currently in the action stage of change and ready to dedicate time achieving and maintaining a healthier weight. Anita Knapp is interested in becoming our patient and working on intensive lifestyle modifications including (but not limited to) diet and exercise for weight loss. ? ?Anita Knapp habits were reviewed today and are as follows: Her family eats meals together, she thinks her family will eat healthier with her, her desired weight loss is 139 pounds, she has been heavy most of her life, she started gaining weight in 2015, her heaviest weight ever was 389 pounds, she skips meals frequently, she is frequently drinking liquids with calories, she frequently makes poor food choices, she frequently eats larger portions than normal, and she struggles with emotional eating. ? ?Depression Screen ?Anita Knapp's Food and Mood (modified PHQ-9) score was 8. ? ?Depression screen [facility] 2/9 11/26/2021  ?Decreased Interest 1  ?Down, Depressed, Hopeless 1  ?PHQ - 2 Score 2  ?Altered sleeping 1  ?Tired, decreased energy 3  ?Change in appetite 1  ?Trouble concentrating 1  ?Moving slowly or fidgety/restless 0  ?Suicidal thoughts 0  ?PHQ-9 Score 8  ?Difficult doing work/chores Somewhat difficult  ? ?Subjective:  ? ?1. Other fatigue ?Anita Knapp will continue activities. Anita Knapp reports daytime somnolence and reports waking up still tired. Patient has a history of symptoms of morning fatigue. Anita Knapp generally gets 5 or 6 hours of sleep per night, and states  that she has difficulty falling asleep and generally restful sleep. Snoring is present. Apneic episodes are not present. Epworth Sleepiness Score is 7.   ? ?2. SOB (shortness of breath) on exertion ?Anita Knapp will continue activities. Anita Knapp notes increasing shortness of breath with exercising and seems to be worsening over time with weight gain. She notes getting out of breath sooner with activity than she used to. This has not gotten worse recently. Anita Knapp denies shortness of breath at rest or orthopnea.  ? ?3. Elevated glucose ?Anita Knapp mother is Pre-diabetes and her aunt has Type 1 diabetes mellitus.  ? ?4. Vitamin D deficiency ?Anita Knapp is taking multi-vitamins.  ? ?Assessment/Plan:  ? ?1. Other fatigue ?Anita Knapp will gradually increase activities. We will check thyroid panel today. Anita Knapp does feel that her weight is causing her energy to be lower than it should be. Fatigue may be related to obesity, depression or many other causes. Labs will be ordered, and in the meanwhile, Anita Knapp will focus on self care including making healthy food choices, increasing physical activity and focusing on stress reduction.  ?- EKG 12-Lead ?- TSH+T4F+T3Free ? ?2. SOB (shortness of breath) on exertion ?Anita Knapp will gradually increase activities. We will check thyroid panel today. Anita Knapp does feel that she gets out of breath more easily that she used to when she exercises. Anita Knapp shortness of breath appears to be obesity related and exercise induced. She has agreed to work on weight loss and gradually increase exercise to treat her exercise induced shortness of breath. Will continue to monitor closely.  ?- TSH+T4F+T3Free ? ?3. Elevated glucose ?  We will check A1C and insulin today.  ? ?- Insulin, random ?- Hemoglobin A1c ? ?4. Vitamin D deficiency ?Low Vitamin D level contributes to fatigue and are associated with obesity, breast, and colon cancer. We will check Vitamin D today. Anita Knapp will follow-up for routine testing of Vitamin D, at  least 2-3 times per year to avoid over-replacement. ? ?- VITAMIN D 25 Hydroxy (Vit-D Deficiency, Fractures) ? ?5. Depression screen ?Anita Knapp had a positive depression screening. Depression is commonly associated with obesity and often results in emotional eating behaviors. We will monitor this closely and work on CBT to help improve the non-hunger eating patterns. Referral to Psychology may be required if no improvement is seen as she continues in our clinic.  ? ?6. Class 3 severe obesity due to excess calories without serious comorbidity with body mass index (BMI) of 60.0 to 69.9 in adult [facility]) ?Anita Knapp is currently in the action stage of change and her goal is to continue with weight loss efforts. I recommend Anita Knapp begin the structured treatment plan as follows: ? ?She has agreed to the Category 3 Plan. ? ?Anita Knapp will have no sugary drinks. We will check CMP and Lipid panel today.  ? ?- Lipid Panel With LDL/HDL Ratio ?- Comprehensive Metabolic Panel ? ?Exercise goals: No exercise has been prescribed at this time.  ? ?Behavioral modification strategies: increasing lean protein intake, decreasing simple carbohydrates, increasing vegetables, increasing water intake, decreasing eating out, no skipping meals, meal planning and cooking strategies, keeping healthy foods in the home, and planning for success. ? ?She was informed of the importance of frequent follow-up visits to maximize her success with intensive lifestyle modifications for her multiple health conditions. She was informed we would discuss her lab results at her next visit unless there is a critical issue that needs to be addressed sooner. Anita Knapp agreed to keep her next visit at the agreed upon time to discuss these results. ? ?Objective:  ? ?Blood pressure 132/82, pulse 87, temperature 98.1 ?F (36.7 ?C), height 5\' 5"  (1.651 m), weight (!) 389 lb (176.4 kg), last menstrual period 10/22/2021, SpO2 100 %. Body mass index is 64.73 kg/m?. ? ?EKG: Normal sinus  rhythm, rate 87 bpm. ? ?Indirect Calorimeter completed today shows a VO2 of 323 and a REE of 2232.  Her calculated basal metabolic rate is 2233 thus her basal metabolic rate is worse than expected. ? ?General: Cooperative, alert, well developed, in no acute distress. ?HEENT: Conjunctivae and lids unremarkable. ?Cardiovascular: Regular rhythm.  ?Lungs: Normal work of breathing. ?Neurologic: No focal deficits.  ? ?Lab Results  ?Component Value Date  ? CREATININE 0.73 07/04/2021  ? BUN 11 07/04/2021  ? NA 137 07/04/2021  ? K 3.7 07/04/2021  ? CL 103 07/04/2021  ? CO2 27 07/04/2021  ? ?No results found for: ALT, AST, GGT, ALKPHOS, BILITOT ?No results found for: HGBA1C ?No results found for: INSULIN ?No results found for: TSH ?No results found for: CHOL, HDL, LDLCALC, LDLDIRECT, TRIG, CHOLHDL ?Lab Results  ?Component Value Date  ? WBC 8.7 07/05/2021  ? HGB 11.7 (L) 07/05/2021  ? HCT 36.2 07/05/2021  ? MCV 86.8 07/05/2021  ? PLT 319 07/05/2021  ? ?No results found for: IRON, TIBC, FERRITIN ? ?Attestation Statements:  ? ?Reviewed by clinician on day of visit: allergies, medications, problem list, medical history, surgical history, family history, social history, and previous encounter notes. ? ?I, 07/07/2021, RMA, am acting as Jackson Latino for Energy manager, DO. ? ?I have reviewed the above  documentation for accuracy and completeness, and I agree with the above. Corinna Capra, DO ? ?

## 2021-11-27 ENCOUNTER — Encounter (INDEPENDENT_AMBULATORY_CARE_PROVIDER_SITE_OTHER): Payer: Self-pay | Admitting: Bariatrics

## 2021-11-27 DIAGNOSIS — N939 Abnormal uterine and vaginal bleeding, unspecified: Secondary | ICD-10-CM | POA: Insufficient documentation

## 2021-11-27 DIAGNOSIS — E559 Vitamin D deficiency, unspecified: Secondary | ICD-10-CM | POA: Insufficient documentation

## 2021-11-27 DIAGNOSIS — E66813 Obesity, class 3: Secondary | ICD-10-CM | POA: Insufficient documentation

## 2021-11-27 DIAGNOSIS — R7303 Prediabetes: Secondary | ICD-10-CM | POA: Insufficient documentation

## 2021-11-27 DIAGNOSIS — D649 Anemia, unspecified: Secondary | ICD-10-CM | POA: Insufficient documentation

## 2021-11-27 LAB — INSULIN, RANDOM: INSULIN: 20.1 u[IU]/mL (ref 2.6–24.9)

## 2021-11-27 LAB — COMPREHENSIVE METABOLIC PANEL
ALT: 8 IU/L (ref 0–32)
AST: 12 IU/L (ref 0–40)
Albumin/Globulin Ratio: 1.3 (ref 1.2–2.2)
Albumin: 3.7 g/dL — ABNORMAL LOW (ref 3.9–5.0)
Alkaline Phosphatase: 77 IU/L (ref 44–121)
BUN/Creatinine Ratio: 10 (ref 9–23)
BUN: 7 mg/dL (ref 6–20)
Bilirubin Total: 0.3 mg/dL (ref 0.0–1.2)
CO2: 22 mmol/L (ref 20–29)
Calcium: 8.8 mg/dL (ref 8.7–10.2)
Chloride: 105 mmol/L (ref 96–106)
Creatinine, Ser: 0.69 mg/dL (ref 0.57–1.00)
Globulin, Total: 2.9 g/dL (ref 1.5–4.5)
Glucose: 78 mg/dL (ref 70–99)
Potassium: 4.6 mmol/L (ref 3.5–5.2)
Sodium: 141 mmol/L (ref 134–144)
Total Protein: 6.6 g/dL (ref 6.0–8.5)
eGFR: 123 mL/min/{1.73_m2} (ref >59)

## 2021-11-27 LAB — TSH+T4F+T3FREE
Free T4: 1.42 ng/dL (ref 0.82–1.77)
T3, Free: 3 pg/mL (ref 2.0–4.4)
TSH: 2.01 u[IU]/mL (ref 0.450–4.500)

## 2021-11-27 LAB — LIPID PANEL WITH LDL/HDL RATIO
Cholesterol, Total: 165 mg/dL (ref 100–199)
HDL: 45 mg/dL (ref >39)
LDL Chol Calc (NIH): 107 mg/dL — ABNORMAL HIGH (ref 0–99)
LDL/HDL Ratio: 2.4 ratio (ref 0.0–3.2)
Triglycerides: 66 mg/dL (ref 0–149)
VLDL Cholesterol Cal: 13 mg/dL (ref 5–40)

## 2021-11-27 LAB — VITAMIN D 25 HYDROXY (VIT D DEFICIENCY, FRACTURES): Vit D, 25-Hydroxy: 7.3 ng/mL — ABNORMAL LOW (ref 30.0–100.0)

## 2021-11-27 LAB — HEMOGLOBIN A1C
Est. average glucose Bld gHb Est-mCnc: 123 mg/dL
Hgb A1c MFr Bld: 5.9 % — ABNORMAL HIGH (ref 4.8–5.6)

## 2021-12-10 ENCOUNTER — Encounter (INDEPENDENT_AMBULATORY_CARE_PROVIDER_SITE_OTHER): Payer: Self-pay | Admitting: Bariatrics

## 2021-12-10 ENCOUNTER — Ambulatory Visit (INDEPENDENT_AMBULATORY_CARE_PROVIDER_SITE_OTHER): Payer: BC Managed Care – PPO | Admitting: Bariatrics

## 2021-12-10 ENCOUNTER — Other Ambulatory Visit: Payer: Self-pay

## 2021-12-10 VITALS — BP 123/84 | HR 89 | Temp 98.0°F | Ht 65.0 in | Wt 388.0 lb

## 2021-12-10 DIAGNOSIS — Z6841 Body Mass Index (BMI) 40.0 and over, adult: Secondary | ICD-10-CM

## 2021-12-10 DIAGNOSIS — E559 Vitamin D deficiency, unspecified: Secondary | ICD-10-CM

## 2021-12-10 DIAGNOSIS — E668 Other obesity: Secondary | ICD-10-CM | POA: Diagnosis not present

## 2021-12-10 DIAGNOSIS — R7303 Prediabetes: Secondary | ICD-10-CM | POA: Diagnosis not present

## 2021-12-10 MED ORDER — VITAMIN D (ERGOCALCIFEROL) 1.25 MG (50000 UNIT) PO CAPS: 50000.0000 [IU] | ORAL_CAPSULE | ORAL | 0 refills | Status: DC

## 2021-12-12 NOTE — Progress Notes (Signed)
? ? ? ?Chief Complaint:  ? ?OBESITY ?Anita Knapp is here to discuss her progress with her obesity treatment plan along with follow-up of her obesity related diagnoses. Anita Knapp is on the Category 3 Plan and states she is following her eating plan approximately 70-80% of the time. Anita Knapp states she is doing 0 minutes 0 times per week. ? ?Today's visit was #: 2 ?Starting weight: 389 lbs ?Starting date: 11/26/2021 ?Today's weight: 388 lbs ?Today's date: 12/10/2021 ?Total lbs lost to date: 1 lb ?Total lbs lost since last in-office visit: 1 lb ? ?Interim History: Anita Knapp is down 1 lb since her first visit. She started 1 week late in getting her foods.  ? ?Subjective:  ? ?1. Vitamin D deficiency ?Anita Knapp's last Vitamin D was 7.3. She is currently not on Vitamin D.  ? ?2. Pre-diabetes ?Anita Knapp's last A1C was 5.9. ? ?Assessment/Plan:  ? ?1. Vitamin D deficiency ?Low Vitamin D level contributes to fatigue and are associated with obesity, breast, and colon cancer. We will refill prescription Vitamin D 50,000 IU every week for 1 month with no refills and Anita Knapp will follow-up for routine testing of Vitamin D, at least 2-3 times per year to avoid over-replacement. ? ?- Vitamin D, Ergocalciferol, (DRISDOL) 1.25 MG (50000 UNIT) CAPS capsule; Take 1 capsule (50,000 Units total) by mouth every 7 (seven) days.  Dispense: 4 capsule; Refill: 0 ? ?2. Pre-diabetes ?Nakeia was provided a handout for pre-diabetes and insulin resistance. She was also provided with a sheet on Metformin. She will continue to work on weight loss, exercise, and decreasing simple carbohydrates to help decrease the risk of diabetes.  ? ?3. Class 3 severe obesity due to excess calories with serious comorbidity and body mass index (BMI) of 60.0 to 69.9 in adult Anita Knapp) ?Anita Knapp is currently in the action stage of change. As such, her goal is to continue with weight loss efforts. She has agreed to the Category 3 Plan.  ? ?Female will continue meal planning and she will continue  intentional eating. We reviewed labs from 11/26/2021 CMP, Lipids, Vitamin D, A1C, insulin, and thyroid panel. She will not skip meals. She will keep her water intake high.  ? ?Exercise goals: No exercise has been prescribed at this time. ? ?Behavioral modification strategies: increasing lean protein intake, decreasing simple carbohydrates, increasing vegetables, increasing water intake, decreasing eating out, no skipping meals, meal planning and cooking strategies, keeping healthy foods in the home, avoiding temptations, and planning for success. ? ?Anita Knapp has agreed to follow-up with our clinic in 2 weeks with Anita Bathe, FNP or Anita Potash, PA-C and 4 weeks with myself. She was informed of the importance of frequent follow-up visits to maximize her success with intensive lifestyle modifications for her multiple health conditions.  ? ?Objective:  ? ?Blood pressure 123/84, pulse 89, temperature 98 ?F (36.7 ?C), height 5\' 5"  (1.651 m), weight (!) 388 lb (176 kg), SpO2 100 %. ?Body mass index is 64.57 kg/m?. ? ?General: Cooperative, alert, well developed, in no acute distress. ?HEENT: Conjunctivae and lids unremarkable. ?Cardiovascular: Regular rhythm.  ?Lungs: Normal work of breathing. ?Neurologic: No focal deficits.  ? ?Lab Results  ?Component Value Date  ? CREATININE 0.69 11/26/2021  ? BUN 7 11/26/2021  ? NA 141 11/26/2021  ? K 4.6 11/26/2021  ? CL 105 11/26/2021  ? CO2 22 11/26/2021  ? ?Lab Results  ?Component Value Date  ? ALT 8 11/26/2021  ? AST 12 11/26/2021  ? ALKPHOS 77 11/26/2021  ? BILITOT 0.3 11/26/2021  ? ?  Lab Results  ?Component Value Date  ? HGBA1C 5.9 (H) 11/26/2021  ? ?Lab Results  ?Component Value Date  ? INSULIN 20.1 11/26/2021  ? ?Lab Results  ?Component Value Date  ? TSH 2.010 11/26/2021  ? ?Lab Results  ?Component Value Date  ? CHOL 165 11/26/2021  ? HDL 45 11/26/2021  ? LDLCALC 107 (H) 11/26/2021  ? TRIG 66 11/26/2021  ? ?Lab Results  ?Component Value Date  ? VD25OH 7.3 (L) 11/26/2021   ? ?Lab Results  ?Component Value Date  ? WBC 8.7 07/05/2021  ? HGB 11.7 (L) 07/05/2021  ? HCT 36.2 07/05/2021  ? MCV 86.8 07/05/2021  ? PLT 319 07/05/2021  ? ?No results found for: IRON, TIBC, FERRITIN ? ?Attestation Statements:  ? ?Reviewed by clinician on day of visit: allergies, medications, problem list, medical history, surgical history, family history, social history, and previous encounter notes. ? ?I, Lizbeth Bark, RMA, am acting as transcriptionist for [employer], DO. ? ?I have reviewed the above documentation for accuracy and completeness, and I agree with the above. Jearld Lesch, DO ? ? ? ? ? ?

## 2021-12-25 ENCOUNTER — Ambulatory Visit (INDEPENDENT_AMBULATORY_CARE_PROVIDER_SITE_OTHER): Payer: BC Managed Care – PPO | Admitting: Nurse Practitioner

## 2021-12-25 DIAGNOSIS — N390 Urinary tract infection, site not specified: Secondary | ICD-10-CM | POA: Diagnosis not present

## 2021-12-25 DIAGNOSIS — R1084 Generalized abdominal pain: Secondary | ICD-10-CM | POA: Diagnosis not present

## 2021-12-25 DIAGNOSIS — Z331 Pregnant state, incidental: Secondary | ICD-10-CM | POA: Diagnosis not present

## 2022-01-08 ENCOUNTER — Ambulatory Visit (INDEPENDENT_AMBULATORY_CARE_PROVIDER_SITE_OTHER): Payer: BC Managed Care – PPO | Admitting: Bariatrics

## 2022-01-08 ENCOUNTER — Ambulatory Visit (INDEPENDENT_AMBULATORY_CARE_PROVIDER_SITE_OTHER): Payer: BC Managed Care – PPO | Admitting: Physician Assistant

## 2022-01-08 ENCOUNTER — Encounter (INDEPENDENT_AMBULATORY_CARE_PROVIDER_SITE_OTHER): Payer: Self-pay | Admitting: Physician Assistant

## 2022-01-08 VITALS — BP 108/75 | Ht 65.0 in | Wt 380.0 lb

## 2022-01-08 DIAGNOSIS — Z6841 Body Mass Index (BMI) 40.0 and over, adult: Secondary | ICD-10-CM | POA: Diagnosis not present

## 2022-01-08 DIAGNOSIS — E559 Vitamin D deficiency, unspecified: Secondary | ICD-10-CM | POA: Diagnosis not present

## 2022-01-08 DIAGNOSIS — E669 Obesity, unspecified: Secondary | ICD-10-CM

## 2022-01-15 DIAGNOSIS — Z3200 Encounter for pregnancy test, result unknown: Secondary | ICD-10-CM | POA: Diagnosis not present

## 2022-01-20 NOTE — Progress Notes (Signed)
? ? ? ?Chief Complaint:  ? ?OBESITY ?Anita Knapp is here to discuss her progress with her obesity treatment plan along with follow-up of her obesity related diagnoses. Anita Knapp is on the Category 3 Plan and states she is following her eating plan approximately 60% of the time. Anita Knapp states she is doing 0 minutes 0 times per week. ? ?Today's visit was #: 3 ?Starting weight: 389 lbs ?Starting date: 11/26/2021 ?Today's weight: 380 lbs ?Today's date: 01/08/2022 ?Total lbs lost to date: 9 ?Total lbs lost since last in-office visit: 8 ? ?Interim History: Anita Knapp just found out that she is pregnant, possibly 4 weeks. She sees an OBGYN next week. Anita Knapp likes Category 3 overall, but does not like milk. She is eating some potatoes with dinner. ? ?Subjective:  ? ?1. Vitamin D deficiency ?Anise is on Vitamin D. She will discuss with OBGYN. Her last level 7.3. ? ?Assessment/Plan:  ? ?1. Vitamin D deficiency ?Anita Knapp will discuss with OBGYN about continuing on Vitamin D. ? ?2. Obesity, with the current BMI of 63.3 ?Anita Knapp is currently in the action stage of change. As such, her goal is to continue with weight loss efforts. She has agreed to the Category 3 Plan.  ? ?Anita Knapp will discontinue cold cuts and talk with OBGYN about dietary restrictions. ? ?Exercise goals: As is. ? ?Behavioral modification strategies: increasing lean protein intake and decreasing simple carbohydrates. ? ?Anita Knapp has agreed to follow-up with our clinic in 4 weeks. She was informed of the importance of frequent follow-up visits to maximize her success with intensive lifestyle modifications for her multiple health conditions.  ? ?Objective:  ? ?Blood pressure 108/75, height 5\' 5"  (1.651 m), weight (!) 380 lb (172.4 kg). ?Body mass index is 63.24 kg/m?. ? ?General: Cooperative, alert, well developed, in no acute distress. ?HEENT: Conjunctivae and lids unremarkable. ?Cardiovascular: Regular rhythm.  ?Lungs: Normal work of breathing. ?Neurologic: No focal deficits.   ? ?Lab Results  ?Component Value Date  ? CREATININE 0.69 11/26/2021  ? BUN 7 11/26/2021  ? NA 141 11/26/2021  ? K 4.6 11/26/2021  ? CL 105 11/26/2021  ? CO2 22 11/26/2021  ? ?Lab Results  ?Component Value Date  ? ALT 8 11/26/2021  ? AST 12 11/26/2021  ? ALKPHOS 77 11/26/2021  ? BILITOT 0.3 11/26/2021  ? ?Lab Results  ?Component Value Date  ? HGBA1C 5.9 (H) 11/26/2021  ? ?Lab Results  ?Component Value Date  ? INSULIN 20.1 11/26/2021  ? ?Lab Results  ?Component Value Date  ? TSH 2.010 11/26/2021  ? ?Lab Results  ?Component Value Date  ? CHOL 165 11/26/2021  ? HDL 45 11/26/2021  ? LDLCALC 107 (H) 11/26/2021  ? TRIG 66 11/26/2021  ? ?Lab Results  ?Component Value Date  ? VD25OH 7.3 (L) 11/26/2021  ? ?Lab Results  ?Component Value Date  ? WBC 8.7 07/05/2021  ? HGB 11.7 (L) 07/05/2021  ? HCT 36.2 07/05/2021  ? MCV 86.8 07/05/2021  ? PLT 319 07/05/2021  ? ?No results found for: IRON, TIBC, FERRITIN ? ?Attestation Statements:  ? ?Reviewed by clinician on day of visit: allergies, medications, problem list, medical history, surgical history, family history, social history, and previous encounter notes. ? ?Time spent on visit including pre-visit chart review and post-visit care and charting was 30 minutes.  ? ?I, Brendell Tyus, am acting as transcriptionist for 07/07/2021, PA-C. ? ?I have reviewed the above documentation for accuracy and completeness, and I agree with the above. [employer], PA-C ? ?

## 2022-02-03 ENCOUNTER — Ambulatory Visit (INDEPENDENT_AMBULATORY_CARE_PROVIDER_SITE_OTHER): Payer: BC Managed Care – PPO | Admitting: Nurse Practitioner

## 2022-02-04 ENCOUNTER — Encounter (INDEPENDENT_AMBULATORY_CARE_PROVIDER_SITE_OTHER): Payer: Self-pay | Admitting: Nurse Practitioner

## 2022-02-04 ENCOUNTER — Ambulatory Visit (INDEPENDENT_AMBULATORY_CARE_PROVIDER_SITE_OTHER): Payer: BC Managed Care – PPO | Admitting: Nurse Practitioner

## 2022-02-04 VITALS — BP 108/77 | HR 84 | Temp 98.0°F | Ht 65.0 in | Wt 382.0 lb

## 2022-02-04 DIAGNOSIS — E559 Vitamin D deficiency, unspecified: Secondary | ICD-10-CM | POA: Diagnosis not present

## 2022-02-04 DIAGNOSIS — Z9189 Other specified personal risk factors, not elsewhere classified: Secondary | ICD-10-CM

## 2022-02-04 DIAGNOSIS — E669 Obesity, unspecified: Secondary | ICD-10-CM

## 2022-02-04 DIAGNOSIS — Z6841 Body Mass Index (BMI) 40.0 and over, adult: Secondary | ICD-10-CM

## 2022-02-04 MED ORDER — WEGOVY 0.25 MG/0.5ML ~~LOC~~ SOAJ: 0.2500 mg | SUBCUTANEOUS | 0 refills | Status: DC

## 2022-02-04 MED ORDER — VITAMIN D (ERGOCALCIFEROL) 1.25 MG (50000 UNIT) PO CAPS: 50000.0000 [IU] | ORAL_CAPSULE | ORAL | 0 refills | Status: DC

## 2022-02-05 NOTE — Progress Notes (Signed)
? ? ? ?Chief Complaint:  ? ?OBESITY ?Anita Knapp is here to discuss her progress with her obesity treatment plan along with follow-up of her obesity related diagnoses. Anita Knapp is on the Category 3 Plan and states she is following her eating plan approximately 70% of the time. Anita Knapp states she is walking for 30 minutes 1 times per week. ? ?Today's visit was #: 4 ?Starting weight: 389 lbs ?Starting date: 11/26/2021 ?Today's weight: 382 lbs ?Today's date: 02/04/2022 ?Total lbs lost to date: 7 lbs ?Total lbs lost since last in-office visit: 0 ? ?Interim History: Anita Knapp saw her OB/GYN since her last visit and reports that she is not pregnant. Her last menstrual cycle was May 5th-9th. She notes some stress and emotional eating. She plans to get back on track. She is drinking water daily. She stopped sugary drinks a month ago. She reports hunger and craving sweets and salty.  ? ?Subjective:  ? ?1. Vitamin D deficiency ?Anita Knapp's last Vitamin D was 7.3. She is not on Vitamin D currently. ? ?2. At risk for side effect of medication ?Anita Knapp is at risk for side effect of medication due to starting Wegovy 0.25 mg. ? ?Assessment/Plan:  ? ?1. Vitamin D deficiency ?Low Vitamin D level contributes to fatigue and are associated with obesity, breast, and colon cancer. We will refill prescription Vitamin D 50,000 IU every week for 1 month with no refills and Anita Knapp will follow-up for routine testing of Vitamin D, at least 2-3 times per year to avoid over-replacement. ? ?- Vitamin D, Ergocalciferol, (DRISDOL) 1.25 MG (50000 UNIT) CAPS capsule; Take 1 capsule (50,000 Units total) by mouth every 7 (seven) days.  Dispense: 4 capsule; Refill: 0 ? ?2. At risk for side effect of medication ?Anita Knapp was given approximately 15 minutes of drug side effect counseling today.  We discussed side effect possibility and risk versus benefits. Anita Knapp agreed to the medication and will contact this office if these side effects are intolerable. ? ?Repetitive  spaced learning was employed today to elicit superior memory formation and behavioral change.  ? ?3. Obesity, with the current BMI of 63.7 ?Anita Knapp is currently in the action stage of change. As such, her goal is to continue with weight loss efforts. She has agreed to the Category 3 Plan.  ? ?Anita Knapp plans to start a protein shake for breakfast if she skips breakfast. She agrees to start Wegovy 0.25 mg. We discussed side effects. Denies history of pancreatitis, gallstones, MEN 2 or medullary thyroid cancer.  Patient knows not to get pregnant while taking any weight loss medications due to the possible risks of birth defects. We discussed to use birth control while taking.  ? ?- Semaglutide-Weight Management (WEGOVY) 0.25 MG/0.5ML SOAJ; Inject 0.25 mg into the skin once a week.  Dispense: 2 mL; Refill: 0 ? ?Exercise goals:  As is.  ? ?Behavioral modification strategies: increasing lean protein intake, increasing water intake, and no skipping meals. ? ?Anita Knapp has agreed to follow-up with our clinic in 3 weeks. She was informed of the importance of frequent follow-up visits to maximize her success with intensive lifestyle modifications for her multiple health conditions.  ? ?Objective:  ? ?Blood pressure 108/77, pulse 84, temperature 98 ?F (36.7 ?C), height 5\' 5"  (1.651 m), weight (!) 382 lb (173.3 kg), SpO2 97 %. ?Body mass index is 63.57 kg/m?. ? ?General: Cooperative, alert, well developed, in no acute distress. ?HEENT: Conjunctivae and lids unremarkable. ?Cardiovascular: Regular rhythm.  ?Lungs: Normal work of breathing. ?Neurologic: No focal deficits.  ? ?  Lab Results  ?Component Value Date  ? CREATININE 0.69 11/26/2021  ? BUN 7 11/26/2021  ? NA 141 11/26/2021  ? K 4.6 11/26/2021  ? CL 105 11/26/2021  ? CO2 22 11/26/2021  ? ?Lab Results  ?Component Value Date  ? ALT 8 11/26/2021  ? AST 12 11/26/2021  ? ALKPHOS 77 11/26/2021  ? BILITOT 0.3 11/26/2021  ? ?Lab Results  ?Component Value Date  ? HGBA1C 5.9 (H) 11/26/2021   ? ?Lab Results  ?Component Value Date  ? INSULIN 20.1 11/26/2021  ? ?Lab Results  ?Component Value Date  ? TSH 2.010 11/26/2021  ? ?Lab Results  ?Component Value Date  ? CHOL 165 11/26/2021  ? HDL 45 11/26/2021  ? LDLCALC 107 (H) 11/26/2021  ? TRIG 66 11/26/2021  ? ?Lab Results  ?Component Value Date  ? VD25OH 7.3 (L) 11/26/2021  ? ?Lab Results  ?Component Value Date  ? WBC 8.7 07/05/2021  ? HGB 11.7 (L) 07/05/2021  ? HCT 36.2 07/05/2021  ? MCV 86.8 07/05/2021  ? PLT 319 07/05/2021  ? ?No results found for: IRON, TIBC, FERRITIN ? ?Attestation Statements:  ? ?Reviewed by clinician on day of visit: allergies, medications, problem list, medical history, surgical history, family history, social history, and previous encounter notes. ? ?I, Jackson Latino, RMA, am acting as Energy manager for Irene Limbo, FNP.  ? ?I have reviewed the above documentation for accuracy and completeness, and I agree with the above. Irene Limbo, FNP  ?

## 2022-02-10 ENCOUNTER — Encounter (INDEPENDENT_AMBULATORY_CARE_PROVIDER_SITE_OTHER): Payer: Self-pay

## 2022-02-10 ENCOUNTER — Telehealth (INDEPENDENT_AMBULATORY_CARE_PROVIDER_SITE_OTHER): Payer: Self-pay | Admitting: Nurse Practitioner

## 2022-02-10 NOTE — Telephone Encounter (Signed)
Anita Knapp- Prior authorization approved for Anita Knapp. Patient sent approval message via mychart.

## 2022-02-24 ENCOUNTER — Ambulatory Visit (INDEPENDENT_AMBULATORY_CARE_PROVIDER_SITE_OTHER): Payer: BC Managed Care – PPO | Admitting: Nurse Practitioner

## 2022-02-24 ENCOUNTER — Other Ambulatory Visit (INDEPENDENT_AMBULATORY_CARE_PROVIDER_SITE_OTHER): Payer: Self-pay | Admitting: Nurse Practitioner

## 2022-02-24 ENCOUNTER — Encounter (INDEPENDENT_AMBULATORY_CARE_PROVIDER_SITE_OTHER): Payer: Self-pay | Admitting: Nurse Practitioner

## 2022-02-24 VITALS — BP 108/72 | HR 81 | Temp 98.1°F | Ht 65.0 in | Wt 383.0 lb

## 2022-02-24 DIAGNOSIS — E559 Vitamin D deficiency, unspecified: Secondary | ICD-10-CM | POA: Diagnosis not present

## 2022-02-24 DIAGNOSIS — Z6841 Body Mass Index (BMI) 40.0 and over, adult: Secondary | ICD-10-CM

## 2022-02-24 DIAGNOSIS — E669 Obesity, unspecified: Secondary | ICD-10-CM

## 2022-02-24 MED ORDER — VITAMIN D (ERGOCALCIFEROL) 1.25 MG (50000 UNIT) PO CAPS: 50000.0000 [IU] | ORAL_CAPSULE | ORAL | 0 refills | Status: DC

## 2022-02-25 NOTE — Progress Notes (Unsigned)
Chief Complaint:   OBESITY Anita Knapp is here to discuss her progress with her obesity treatment plan along with follow-up of her obesity related diagnoses. Anita Knapp is on the Category 3 Plan and states she is following her eating plan approximately 70% of the time. Anita Knapp states she is doing gym exercise and walking for 45 minutes 2 times per week.  Today's visit was #: 5 Starting weight: 389 lbs Starting date: 11/26/2021 Today's weight: 383 lbs Today's date: 02/24/2022 Total lbs lost to date: 6 lbs Total lbs lost since last in-office visit: 0  Interim History: Anita Knapp hasn't started [facility] yet. She wasn't aware that the pre-authorization was approved. She struggles with cravings and hunger. She is drinking water and Prime drinks. Her goal is to be able to wear a size 20 pants.   Subjective:   1. Vitamin D deficiency Anita Knapp is currently taking prescription vitamin D 50,000 IU weekly times 4. She denies nausea, vomiting or muscle weakness.  Assessment/Plan:   1. Vitamin D deficiency Low Vitamin D level contributes to fatigue and are associated with obesity, breast, and colon cancer. We will refill  prescription Vitamin D 50,000 IU weekly for 1 month with no refills and Anita Knapp will follow-up for routine testing of Vitamin D, at least 2-3 times per year to avoid over-replacement.  - Vitamin D, Ergocalciferol, (DRISDOL) 1.25 MG (50000 UNIT) CAPS capsule; Take 1 capsule (50,000 Units total) by mouth every 7 (seven) days.  Dispense: 4 capsule; Refill: 0  2. Obesity, with the current BMI of 63.9 Anita Knapp is currently in the action stage of change. As such, her goal is to continue with weight loss efforts. She has agreed to the Category 3 Plan.   Anita Knapp agrees to start [employer]. We discussed side effects.   Exercise goals:  As is.   Behavioral modification strategies: increasing lean protein intake, increasing water intake, no skipping meals, and meal planning and cooking strategies.  Anita Knapp  has agreed to follow-up with our clinic in 3 weeks. She was informed of the importance of frequent follow-up visits to maximize her success with intensive lifestyle modifications for her multiple health conditions.   Objective:   Blood pressure 108/72, pulse 81, temperature 98.1 F (36.7 C), height 5\' 5"  (1.651 m), weight (!) 383 lb (173.7 kg), SpO2 99 %. Body mass index is 63.73 kg/m.  General: Cooperative, alert, well developed, in no acute distress. HEENT: Conjunctivae and lids unremarkable. Cardiovascular: Regular rhythm.  Lungs: Normal work of breathing. Neurologic: No focal deficits.   Lab Results  Component Value Date   CREATININE 0.69 11/26/2021   BUN 7 11/26/2021   NA 141 11/26/2021   K 4.6 11/26/2021   CL 105 11/26/2021   CO2 22 11/26/2021   Lab Results  Component Value Date   ALT 8 11/26/2021   AST 12 11/26/2021   ALKPHOS 77 11/26/2021   BILITOT 0.3 11/26/2021   Lab Results  Component Value Date   HGBA1C 5.9 (H) 11/26/2021   Lab Results  Component Value Date   INSULIN 20.1 11/26/2021   Lab Results  Component Value Date   TSH 2.010 11/26/2021   Lab Results  Component Value Date   CHOL 165 11/26/2021   HDL 45 11/26/2021   LDLCALC 107 (H) 11/26/2021   TRIG 66 11/26/2021   Lab Results  Component Value Date   VD25OH 7.3 (L) 11/26/2021   Lab Results  Component Value Date   WBC 8.7 07/05/2021   HGB 11.7 (L) 07/05/2021  HCT 36.2 07/05/2021   MCV 86.8 07/05/2021   PLT 319 07/05/2021   No results found for: IRON, TIBC, FERRITIN  Attestation Statements:   Reviewed by clinician on day of visit: allergies, medications, problem list, medical history, surgical history, family history, social history, and previous encounter notes.  I, Jackson Latino, RMA, am acting as Energy manager for Irene Limbo, FNP.   I have reviewed the above documentation for accuracy and completeness, and I agree with the above. Irene Limbo, FNP

## 2022-03-03 NOTE — Telephone Encounter (Signed)
Anita Knapp

## 2022-03-13 DIAGNOSIS — Z01419 Encounter for gynecological examination (general) (routine) without abnormal findings: Secondary | ICD-10-CM | POA: Diagnosis not present

## 2022-03-13 DIAGNOSIS — Z113 Encounter for screening for infections with a predominantly sexual mode of transmission: Secondary | ICD-10-CM | POA: Diagnosis not present

## 2022-03-13 DIAGNOSIS — Z124 Encounter for screening for malignant neoplasm of cervix: Secondary | ICD-10-CM | POA: Diagnosis not present

## 2022-03-19 ENCOUNTER — Encounter (INDEPENDENT_AMBULATORY_CARE_PROVIDER_SITE_OTHER): Payer: Self-pay | Admitting: Nurse Practitioner

## 2022-03-19 ENCOUNTER — Ambulatory Visit (INDEPENDENT_AMBULATORY_CARE_PROVIDER_SITE_OTHER): Payer: BC Managed Care – PPO | Admitting: Nurse Practitioner

## 2022-03-19 VITALS — BP 109/75 | HR 85 | Temp 98.0°F | Ht 65.0 in | Wt 382.0 lb

## 2022-03-19 DIAGNOSIS — E559 Vitamin D deficiency, unspecified: Secondary | ICD-10-CM

## 2022-03-19 DIAGNOSIS — D649 Anemia, unspecified: Secondary | ICD-10-CM | POA: Diagnosis not present

## 2022-03-19 DIAGNOSIS — Z6841 Body Mass Index (BMI) 40.0 and over, adult: Secondary | ICD-10-CM

## 2022-03-19 DIAGNOSIS — E669 Obesity, unspecified: Secondary | ICD-10-CM | POA: Diagnosis not present

## 2022-03-19 MED ORDER — VITAMIN D (ERGOCALCIFEROL) 1.25 MG (50000 UNIT) PO CAPS: 50000.0000 [IU] | ORAL_CAPSULE | ORAL | 0 refills | Status: DC

## 2022-03-19 NOTE — Progress Notes (Signed)
Chief Complaint:   OBESITY Anita Knapp is here to discuss her progress with her obesity treatment plan along with follow-up of her obesity related diagnoses. Anita Knapp is on the Category 3 Plan and states she is following her eating plan approximately 70% of the time. Anita Knapp states she is walking and lifting weights 45 minutes 2 times per week.  Today's visit was #: 6 Starting weight: 389 lbs Starting date: 11/26/2021 Today's weight: 382 lbs Today's date: 03/19/2022 Total lbs lost to date: 7 lbs Total lbs lost since last in-office visit: 1 lb  Interim History: Anita Knapp has not been able to start Wegovy due to [employer].  She is doing well with Category 3.  Struggles with eating yogurt.  She has not been skipping meals, she drinks water but struggles with getting enough during the day.  She has a party to go to this weekend.  Subjective:   1. Vitamin D deficiency She is currently taking prescription vitamin D 50,000 IU each week. She denies nausea, vomiting or muscle weakness.  2. Anemia, unspecified type Anita Knapp is taking iron once per week which was prescribed by her GYN. - ferrous sulfate 325 (65 FE) MG EC tablet; 1 tablet  Assessment/Plan:   1. Vitamin D deficiency Low Vitamin D level contributes to fatigue and are associated with obesity, breast, and colon cancer. She agrees to continue to take prescription Vitamin D @50 ,000 IU every week and will follow-up for routine testing of Vitamin D, at least 2-3 times per year to avoid over-replacement.  Side effects discussed.  Refill- Vitamin D, Ergocalciferol, (DRISDOL) 1.25 MG (50000 UNIT) CAPS capsule; Take 1 capsule (50,000 Units total) by mouth every 7 (seven) days.  Dispense: 4 capsule; Refill: 0  2. Anemia, unspecified type We will check labs at next visit.  3. Obesity, with the current BMI of 63.6 We will obtain labs at next office visit.  She will consider Saxenda at next office visit.  Anita Knapp is currently in the action  stage of change. As such, her goal is to continue with weight loss efforts. She has agreed to the Category 3 Plan.   Exercise goals:  As is.  Behavioral modification strategies: increasing lean protein intake, increasing water intake, and planning for success.  Anita Knapp has agreed to follow-up with our clinic in 3 weeks. She was informed of the importance of frequent follow-up visits to maximize her success with intensive lifestyle modifications for her multiple health conditions.   Objective:   Blood pressure 109/75, pulse 85, temperature 98 F (36.7 C), height 5\' 5"  (1.651 m), weight (!) 382 lb (173.3 kg), SpO2 97 %. Body mass index is 63.57 kg/m.  General: Cooperative, alert, well developed, in no acute distress. HEENT: Conjunctivae and lids unremarkable. Cardiovascular: Regular rhythm.  Lungs: Normal work of breathing. Neurologic: No focal deficits.   Lab Results  Component Value Date   CREATININE 0.69 11/26/2021   BUN 7 11/26/2021   NA 141 11/26/2021   K 4.6 11/26/2021   CL 105 11/26/2021   CO2 22 11/26/2021   Lab Results  Component Value Date   ALT 8 11/26/2021   AST 12 11/26/2021   ALKPHOS 77 11/26/2021   BILITOT 0.3 11/26/2021   Lab Results  Component Value Date   HGBA1C 5.9 (H) 11/26/2021   Lab Results  Component Value Date   INSULIN 20.1 11/26/2021   Lab Results  Component Value Date   TSH 2.010 11/26/2021   Lab Results  Component Value Date  CHOL 165 11/26/2021   HDL 45 11/26/2021   LDLCALC 107 (H) 11/26/2021   TRIG 66 11/26/2021   Lab Results  Component Value Date   VD25OH 7.3 (L) 11/26/2021   Lab Results  Component Value Date   WBC 8.7 07/05/2021   HGB 11.7 (L) 07/05/2021   HCT 36.2 07/05/2021   MCV 86.8 07/05/2021   PLT 319 07/05/2021   No results found for: "IRON", "TIBC", "FERRITIN"  Attestation Statements:   Reviewed by clinician on day of visit: allergies, medications, problem list, medical history, surgical history, family  history, social history, and previous encounter notes.  I, Malcolm Metro, RMA, am acting as transcriptionist for Irene Limbo, FNP  I have reviewed the above documentation for accuracy and completeness, and I agree with the above. Irene Limbo, FNP

## 2022-04-16 ENCOUNTER — Ambulatory Visit (INDEPENDENT_AMBULATORY_CARE_PROVIDER_SITE_OTHER): Payer: BC Managed Care – PPO | Admitting: Nurse Practitioner

## 2022-04-21 ENCOUNTER — Encounter (INDEPENDENT_AMBULATORY_CARE_PROVIDER_SITE_OTHER): Payer: Self-pay | Admitting: Nurse Practitioner

## 2022-04-21 ENCOUNTER — Ambulatory Visit (INDEPENDENT_AMBULATORY_CARE_PROVIDER_SITE_OTHER): Payer: BC Managed Care – PPO | Admitting: Nurse Practitioner

## 2022-04-21 VITALS — BP 117/79 | HR 82 | Temp 97.9°F | Ht 65.0 in | Wt 382.0 lb

## 2022-04-21 DIAGNOSIS — E669 Obesity, unspecified: Secondary | ICD-10-CM | POA: Diagnosis not present

## 2022-04-21 DIAGNOSIS — R7303 Prediabetes: Secondary | ICD-10-CM | POA: Diagnosis not present

## 2022-04-21 DIAGNOSIS — E559 Vitamin D deficiency, unspecified: Secondary | ICD-10-CM | POA: Diagnosis not present

## 2022-04-21 DIAGNOSIS — Z6841 Body Mass Index (BMI) 40.0 and over, adult: Secondary | ICD-10-CM

## 2022-04-21 DIAGNOSIS — D649 Anemia, unspecified: Secondary | ICD-10-CM

## 2022-04-21 MED ORDER — VITAMIN D (ERGOCALCIFEROL) 1.25 MG (50000 UNIT) PO CAPS: 50000.0000 [IU] | ORAL_CAPSULE | ORAL | 0 refills | Status: AC

## 2022-04-22 LAB — CBC WITH DIFFERENTIAL/PLATELET
Basophils Absolute: 0 10*3/uL (ref 0.0–0.2)
Basos: 0 %
EOS (ABSOLUTE): 0.1 10*3/uL (ref 0.0–0.4)
Eos: 1 %
Hematocrit: 37.8 % (ref 34.0–46.6)
Hemoglobin: 11.8 g/dL (ref 11.1–15.9)
Immature Grans (Abs): 0 10*3/uL (ref 0.0–0.1)
Immature Granulocytes: 0 %
Lymphocytes Absolute: 2.5 10*3/uL (ref 0.7–3.1)
Lymphs: 48 %
MCH: 25.7 pg — ABNORMAL LOW (ref 26.6–33.0)
MCHC: 31.2 g/dL — ABNORMAL LOW (ref 31.5–35.7)
MCV: 82 fL (ref 79–97)
Monocytes Absolute: 0.4 10*3/uL (ref 0.1–0.9)
Monocytes: 7 %
Neutrophils Absolute: 2.4 10*3/uL (ref 1.4–7.0)
Neutrophils: 44 %
Platelets: 348 10*3/uL (ref 150–450)
RBC: 4.6 x10E6/uL (ref 3.77–5.28)
RDW: 16.5 % — ABNORMAL HIGH (ref 11.7–15.4)
WBC: 5.3 10*3/uL (ref 3.4–10.8)

## 2022-04-22 LAB — INSULIN, RANDOM: INSULIN: 20.4 u[IU]/mL (ref 2.6–24.9)

## 2022-04-22 LAB — COMPREHENSIVE METABOLIC PANEL
ALT: 10 IU/L (ref 0–32)
AST: 19 IU/L (ref 0–40)
Albumin/Globulin Ratio: 1.5 (ref 1.2–2.2)
Albumin: 3.8 g/dL — ABNORMAL LOW (ref 4.0–5.0)
Alkaline Phosphatase: 69 IU/L (ref 44–121)
BUN/Creatinine Ratio: 17 (ref 9–23)
BUN: 12 mg/dL (ref 6–20)
Bilirubin Total: 0.3 mg/dL (ref 0.0–1.2)
CO2: 23 mmol/L (ref 20–29)
Calcium: 9.1 mg/dL (ref 8.7–10.2)
Chloride: 101 mmol/L (ref 96–106)
Creatinine, Ser: 0.7 mg/dL (ref 0.57–1.00)
Globulin, Total: 2.6 g/dL (ref 1.5–4.5)
Glucose: 83 mg/dL (ref 70–99)
Potassium: 4.4 mmol/L (ref 3.5–5.2)
Sodium: 136 mmol/L (ref 134–144)
Total Protein: 6.4 g/dL (ref 6.0–8.5)
eGFR: 122 mL/min/{1.73_m2} (ref >59)

## 2022-04-22 LAB — HEMOGLOBIN A1C
Est. average glucose Bld gHb Est-mCnc: 117 mg/dL
Hgb A1c MFr Bld: 5.7 % — ABNORMAL HIGH (ref 4.8–5.6)

## 2022-04-22 LAB — IRON: Iron: 38 ug/dL (ref 27–159)

## 2022-04-22 LAB — FERRITIN: Ferritin: 18 ng/mL (ref 15–150)

## 2022-04-23 NOTE — Progress Notes (Unsigned)
Chief Complaint:   OBESITY Anita Knapp is here to discuss her progress with her obesity treatment plan along with follow-up of her obesity related diagnoses. Anita Knapp is on the Category 3 Plan and states she is following her eating plan approximately 80% of the time. Anita Knapp states she is using treadmill 45 minutes 2 times per week.  Today's visit was #: 7 Starting weight: 389 lbs Starting date: 11/26/2021 Today's weight: 382 lbs Today's date: 04/21/2022 Total lbs lost to date: 7 lbs Total lbs lost since last in-office visit: 0  Interim History: Anita Knapp has maintained her weight since her last visit. Next celebration is for her birthday. No upcoming vacations. She is struggling with eating breakfast due to not being hungry. Usually she eats a granola bar, then finds she is hungry 1-2 hours later. Then she will snack on bananas and granola bars. She has no problem with lunch or dinner.  Subjective:   1. Vitamin D deficiency Anita Knapp is currently taking prescription Vit D 50,000 IU once a week. Denies any nausea, vomiting or muscle weakness.  2. Anemia, unspecified type Anita Knapp took iron last 2 weeks ago. Note some fatigue.  3. Pre-diabetes Anita Knapp has never been on medications. Reports polyphagia and cravings.  Assessment/Plan:   1. Vitamin D deficiency We will obtain labs today. Low Vitamin D level contributes to fatigue and are associated with obesity, breast, and colon cancer. She agrees to continue to take prescription Vitamin D @50 ,000 IU every week and will follow-up for routine testing of Vitamin D, at least 2-3 times per year to avoid over-replacement.  Refill Vit D 50,000 IU once a week for 1 month with 0 refills.   -Refill Vitamin D, Ergocalciferol, (DRISDOL) 1.25 MG (50000 UNIT) CAPS capsule; Take 1 capsule (50,000 Units total) by mouth every 7 (seven) days.  Dispense: 4 capsule; Refill: 0  - Comprehensive metabolic panel  2. Anemia, unspecified type Labs discussed during  visit today.  - Comprehensive metabolic panel - Ferritin - CBC with Differential/Platelet - Iron  3. Pre-diabetes Labs discussed during visit today. Anita Knapp will continue to work on weight loss, exercise, and decreasing simple carbohydrates to help decrease the risk of diabetes.   - Comprehensive metabolic panel - Insulin, random - Hemoglobin A1c  4. Obesity, with the current BMI of 63.7 Anita Knapp is currently in the action stage of change. As such, her goal is to continue with weight loss efforts. She has agreed to the Category 3 Plan.   Anita Knapp will consider Saxenda at next visit.  Exercise goals: As is.  Behavioral modification strategies: increasing lean protein intake, increasing water intake, no skipping meals, and meal planning and cooking strategies.  Anita Knapp has agreed to follow-up with our clinic in 3 weeks. She was informed of the importance of frequent follow-up visits to maximize her success with intensive lifestyle modifications for her multiple health conditions.   Anita Knapp was informed we would discuss her lab results at her next visit unless there is a critical issue that needs to be addressed sooner. Anita Knapp agreed to keep her next visit at the agreed upon time to discuss these results.  Objective:   Blood pressure 117/79, pulse 82, temperature 97.9 F (36.6 C), height 5\' 5"  (1.651 m), weight (!) 382 lb (173.3 kg), SpO2 99 %. Body mass index is 63.57 kg/m.  General: Cooperative, alert, well developed, in no acute distress. HEENT: Conjunctivae and lids unremarkable. Cardiovascular: Regular rhythm.  Lungs: Normal work of breathing. Neurologic: No focal deficits.  Lab Results  Component Value Date   CREATININE 0.70 04/21/2022   BUN 12 04/21/2022   NA 136 04/21/2022   K 4.4 04/21/2022   CL 101 04/21/2022   CO2 23 04/21/2022   Lab Results  Component Value Date   ALT 10 04/21/2022   AST 19 04/21/2022   ALKPHOS 69 04/21/2022   BILITOT 0.3 04/21/2022   Lab  Results  Component Value Date   HGBA1C 5.7 (H) 04/21/2022   HGBA1C 5.9 (H) 11/26/2021   Lab Results  Component Value Date   INSULIN 20.4 04/21/2022   INSULIN 20.1 11/26/2021   Lab Results  Component Value Date   TSH 2.010 11/26/2021   Lab Results  Component Value Date   CHOL 165 11/26/2021   HDL 45 11/26/2021   LDLCALC 107 (H) 11/26/2021   TRIG 66 11/26/2021   Lab Results  Component Value Date   VD25OH 7.3 (L) 11/26/2021   Lab Results  Component Value Date   WBC 5.3 04/21/2022   HGB 11.8 04/21/2022   HCT 37.8 04/21/2022   MCV 82 04/21/2022   PLT 348 04/21/2022   Lab Results  Component Value Date   IRON 38 04/21/2022   FERRITIN 18 04/21/2022   Attestation Statements:   Reviewed by clinician on day of visit: allergies, medications, problem list, medical history, surgical history, family history, social history, and previous encounter notes.  I, Brendell Tyus, RMA, am acting as transcriptionist for Irene Limbo, FNP.  I have reviewed the above documentation for accuracy and completeness, and I agree with the above. Irene Limbo, FNP

## 2022-04-30 ENCOUNTER — Encounter (INDEPENDENT_AMBULATORY_CARE_PROVIDER_SITE_OTHER): Payer: Self-pay

## 2022-05-12 NOTE — Progress Notes (Unsigned)
TeleHealth Visit:  This visit was completed with telemedicine (audio/video) technology. Anita Knapp has verbally consented to this TeleHealth visit. The patient is located at home, the provider is located at home. The participants in this visit include the listed provider and patient. The visit was conducted today via MyChart video.  OBESITY Anita Knapp is here to discuss her progress with her obesity treatment plan along with follow-up of her obesity related diagnoses.   Today's visit was # 8 Starting weight: 389 lbs Starting date: 11/26/2021 Weight at last in office visit: 382 lbs on 04/21/22 Total weight loss: 7 lbs at last in office visit on 04/21/22. Today's reported weight: No weight reported.  Nutrition Plan: the Category 3 Plan.  Hunger is well controlled.  Current exercise: walking at work- 2000 steps per day.  Interim History: Anita Knapp has now started to eat breakfast most days.  She is eating overnight oats that she buys at [employer] where she works.  She will eat cheese and eggs.  She does not eat/drink milk, yogurt, cottage cheese. At lunch she generally has a sandwich with 4 ounces of meat and meat and vegetable for dinner.  She was prescribed Wegovy which is covered by her insurance but it is not available at 0.25 mg dose.  We discussed both Saxenda and Ozempic but she wants to wait and may consider starting these in the future.  Assessment/Plan:  1. Prediabetes A1c improved to 5.7 from 5.9 (11/26/21). Medication(s): none Appetite well controlled. Lab Results  Component Value Date   HGBA1C 5.7 (H) 04/21/2022   Lab Results  Component Value Date   INSULIN 20.4 04/21/2022   INSULIN 20.1 11/26/2021    Plan: Continue to work on reducing simple carbohydrates. May consider starting Saxenda or Ozempic in the future.  2. Iron deficiency anemia Anemia is stable.  Hemoglobin was 11.7 in October 2022.  Hemoglobin is now 11.8.  She has not been taking iron supplementation for a  while.  Denies menorrhagia currently.  Lab Results  Component Value Date   WBC 5.3 04/21/2022   HGB 11.8 04/21/2022   HCT 37.8 04/21/2022   MCV 82 04/21/2022   PLT 348 04/21/2022   Lab Results  Component Value Date   IRON 38 04/21/2022   FERRITIN 18 04/21/2022     Plan: Continue to monitor  3. Obesity: Current BMI 63.5 Anita Knapp is currently in the action stage of change. As such, her goal is to continue with weight loss efforts.  She has agreed to the Category 3 Plan.   We went over her recent lab results today. MyChart message sent containing journaling numbers for breakfast and egg muffin recipe.  Exercise goals: as is.  Behavioral modification strategies: increasing lean protein intake, decreasing simple carbohydrates, no skipping meals, and meal planning and cooking strategies.  Anita Knapp has agreed to follow-up with our clinic in 2 weeks.   No orders of the defined types were placed in this encounter.   There are no discontinued medications.   No orders of the defined types were placed in this encounter.     Objective:   VITALS: Per patient if applicable, see vitals. GENERAL: Alert and in no acute distress. CARDIOPULMONARY: No increased WOB. Speaking in clear sentences.  PSYCH: Pleasant and cooperative. Speech normal rate and rhythm. Affect is appropriate. Insight and judgement are appropriate. Attention is focused, linear, and appropriate.  NEURO: Oriented as arrived to appointment on time with no prompting.   Lab Results  Component Value Date   CREATININE  0.70 04/21/2022   BUN 12 04/21/2022   NA 136 04/21/2022   K 4.4 04/21/2022   CL 101 04/21/2022   CO2 23 04/21/2022   Lab Results  Component Value Date   ALT 10 04/21/2022   AST 19 04/21/2022   ALKPHOS 69 04/21/2022   BILITOT 0.3 04/21/2022   Lab Results  Component Value Date   HGBA1C 5.7 (H) 04/21/2022   HGBA1C 5.9 (H) 11/26/2021   Lab Results  Component Value Date   INSULIN 20.4 04/21/2022    INSULIN 20.1 11/26/2021   Lab Results  Component Value Date   TSH 2.010 11/26/2021   Lab Results  Component Value Date   CHOL 165 11/26/2021   HDL 45 11/26/2021   LDLCALC 107 (H) 11/26/2021   TRIG 66 11/26/2021   Lab Results  Component Value Date   WBC 5.3 04/21/2022   HGB 11.8 04/21/2022   HCT 37.8 04/21/2022   MCV 82 04/21/2022   PLT 348 04/21/2022   Lab Results  Component Value Date   IRON 38 04/21/2022   FERRITIN 18 04/21/2022   Lab Results  Component Value Date   VD25OH 7.3 (L) 11/26/2021    Attestation Statements:   Reviewed by clinician on day of visit: allergies, medications, problem list, medical history, surgical history, family history, social history, and previous encounter notes.  Time spent on visit including the items listed below was 32 minutes.  -preparing to see the patient (e.g., review of tests, history, previous notes) -obtaining and/or reviewing separately obtained history -counseling and educating the patient/family/caregiver -documenting clinical information in the electronic or other health record -ordering medications, tests, or procedures -independently interpreting results and communicating results to the patient/ family/caregiver

## 2022-05-13 ENCOUNTER — Ambulatory Visit (INDEPENDENT_AMBULATORY_CARE_PROVIDER_SITE_OTHER): Payer: BC Managed Care – PPO | Admitting: Nurse Practitioner

## 2022-05-13 ENCOUNTER — Encounter (INDEPENDENT_AMBULATORY_CARE_PROVIDER_SITE_OTHER): Payer: Self-pay | Admitting: Family Medicine

## 2022-05-13 ENCOUNTER — Telehealth (INDEPENDENT_AMBULATORY_CARE_PROVIDER_SITE_OTHER): Payer: BC Managed Care – PPO | Admitting: Family Medicine

## 2022-05-13 DIAGNOSIS — R7303 Prediabetes: Secondary | ICD-10-CM

## 2022-05-13 DIAGNOSIS — E669 Obesity, unspecified: Secondary | ICD-10-CM | POA: Diagnosis not present

## 2022-05-13 DIAGNOSIS — D509 Iron deficiency anemia, unspecified: Secondary | ICD-10-CM | POA: Insufficient documentation

## 2022-05-13 DIAGNOSIS — Z6841 Body Mass Index (BMI) 40.0 and over, adult: Secondary | ICD-10-CM

## 2022-05-27 ENCOUNTER — Ambulatory Visit (INDEPENDENT_AMBULATORY_CARE_PROVIDER_SITE_OTHER): Payer: BC Managed Care – PPO | Admitting: Nurse Practitioner

## 2022-10-14 IMAGING — US US PELVIS COMPLETE WITH TRANSVAGINAL
1 series · 14 of 25 positions shown · non-contrast
Comparison: None

CLINICAL DATA: Abnormal uterine bleeding

EXAM:
TRANSABDOMINAL AND TRANSVAGINAL ULTRASOUND OF PELVIS
TECHNIQUE: Both transabdominal and transvaginal ultrasound examinations of the
pelvis were performed. Transabdominal technique was performed for
global imaging of the pelvis including uterus, ovaries, adnexal
regions, and pelvic cul-de-sac. It was necessary to proceed with
endovaginal exam following the transabdominal exam to visualize the
uterus endometrium ovaries.

[Series 1: us pelvis complete with transvaginal · 0.38mm/px · 14 of 42 slices shown]
[im 1/42]
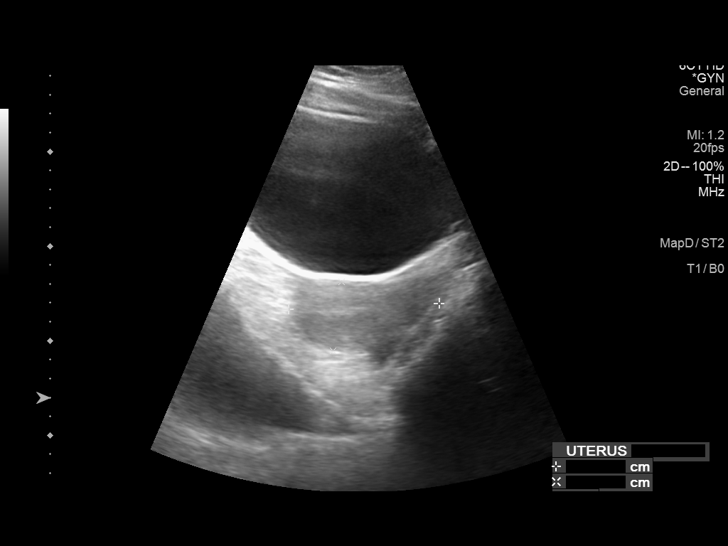
[im 4/42]
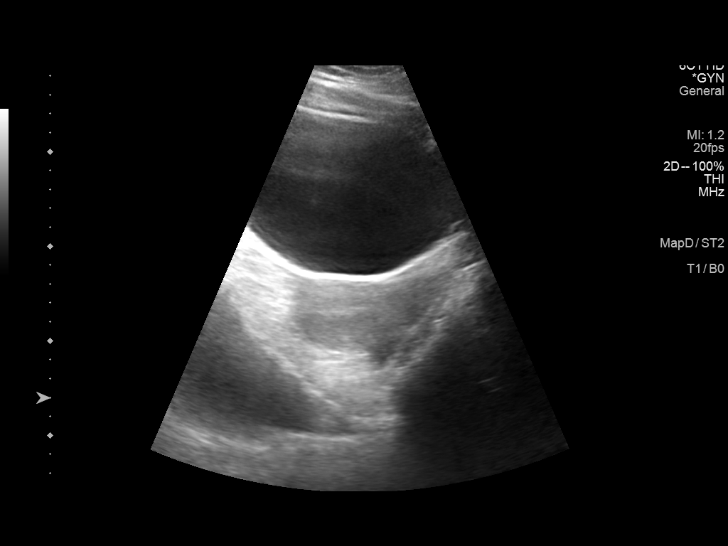
[im 7/42]
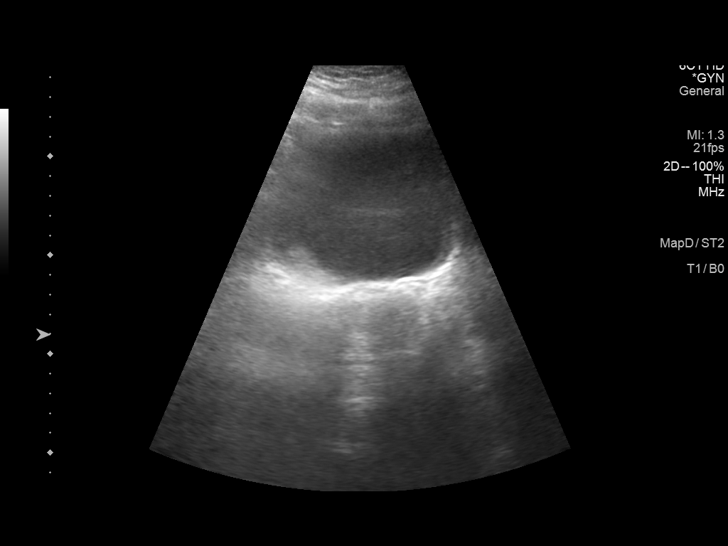
[im 11/42]
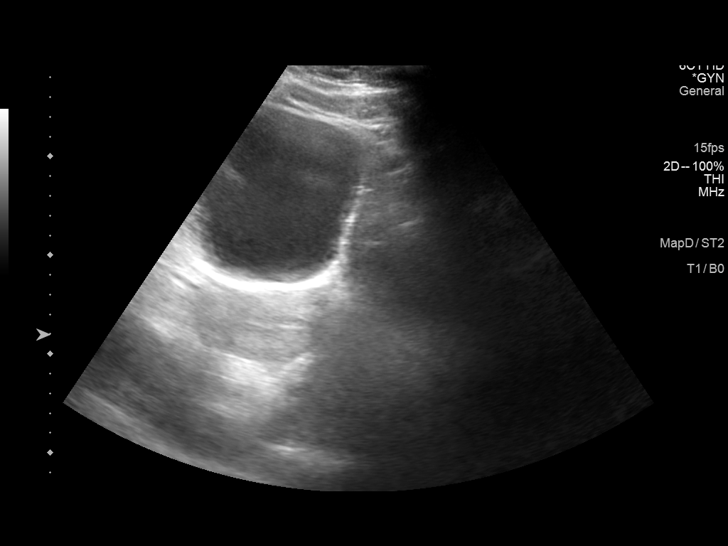
[im 14/42]
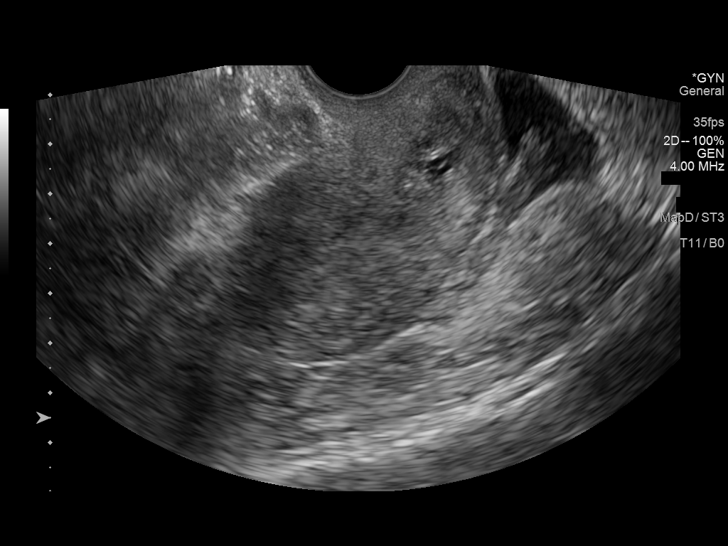
[im 16/42]
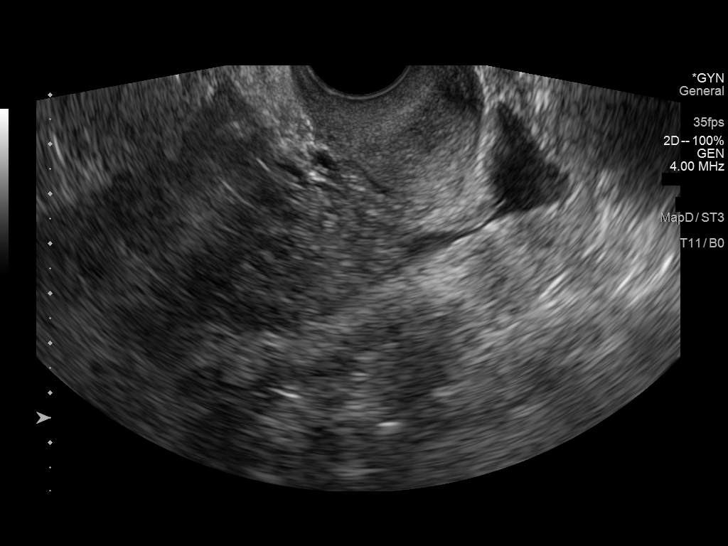
[im 19/42]
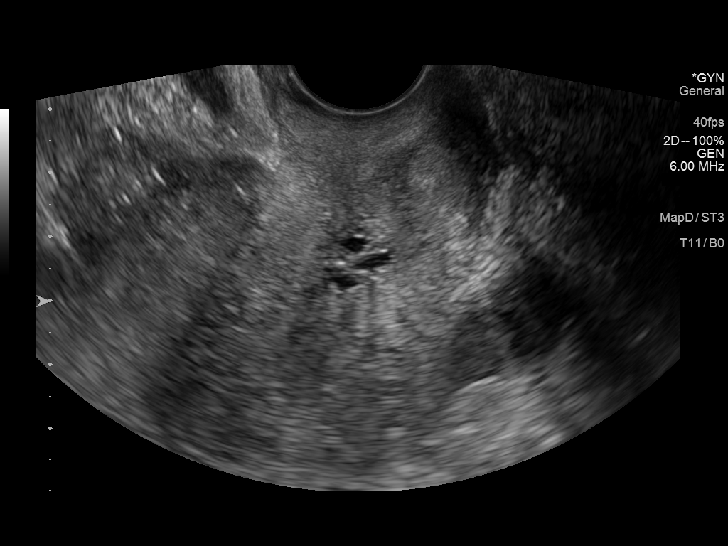
[im 23/42]
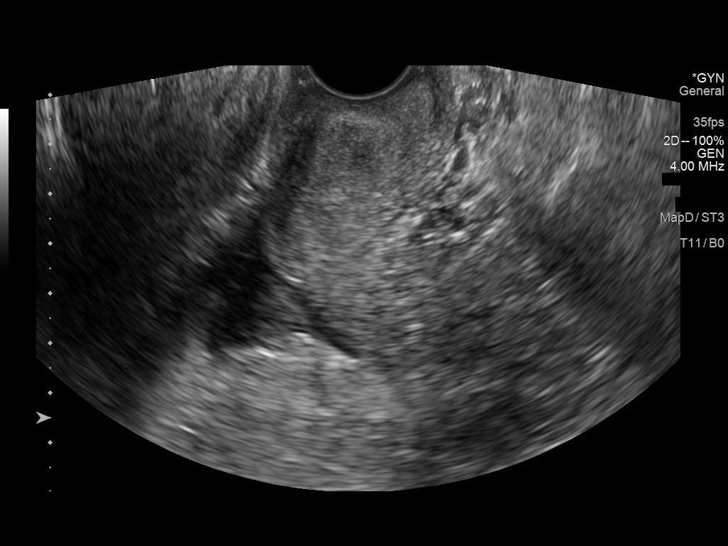
[im 26/42]
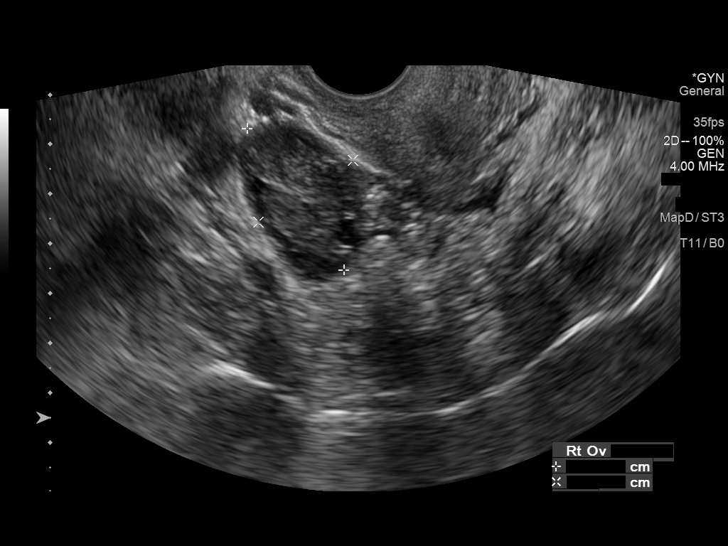
[im 28/42]
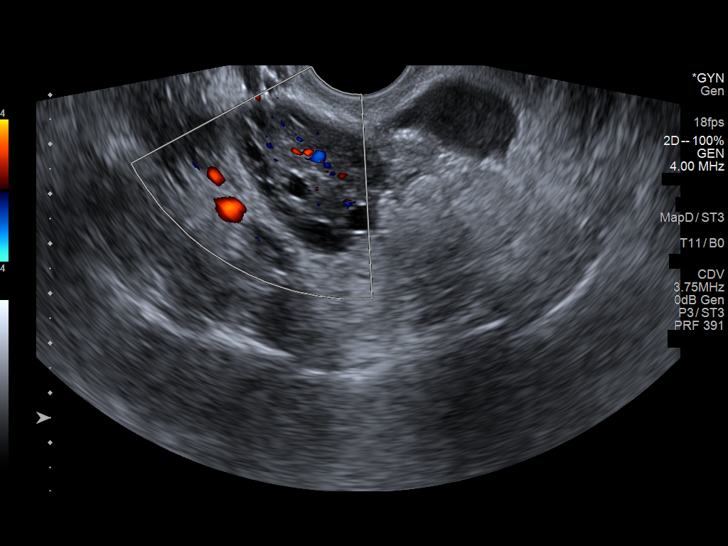
[im 31/42]
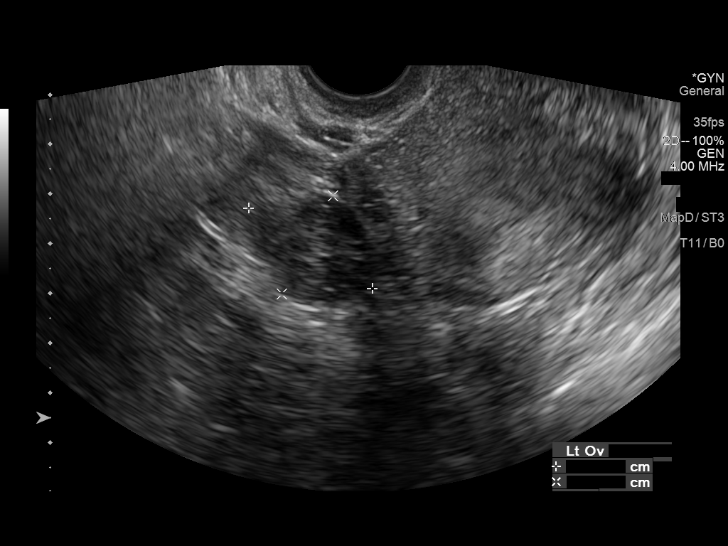
[im 35/42]
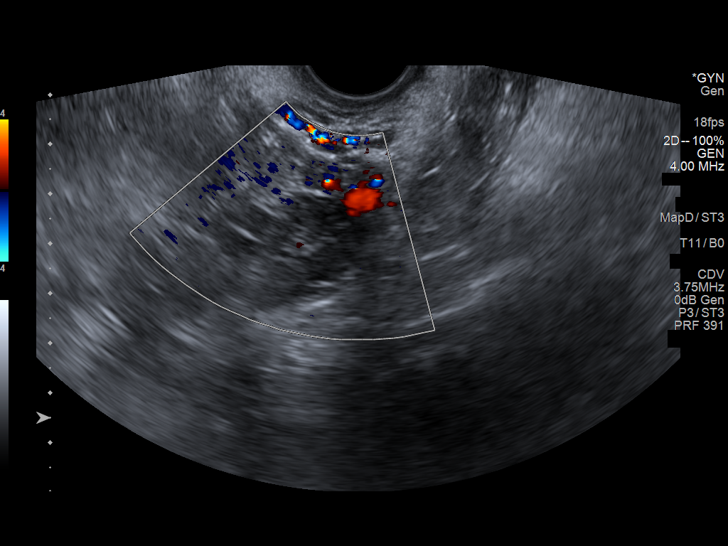
[im 38/42]
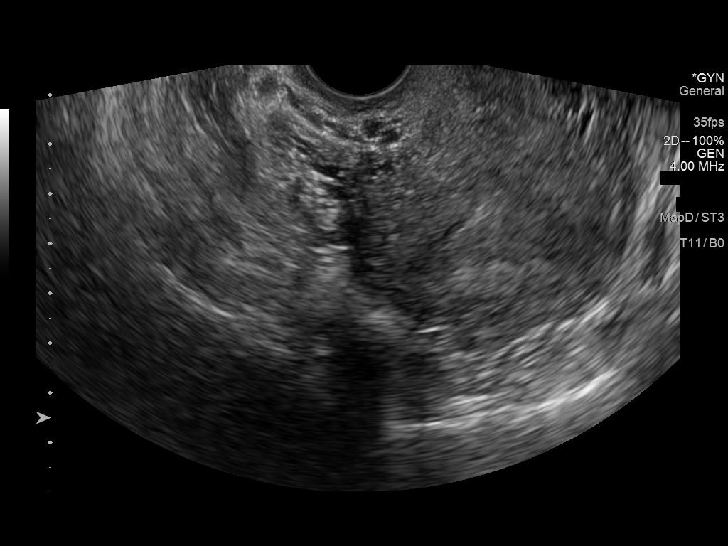
[im 42/42]
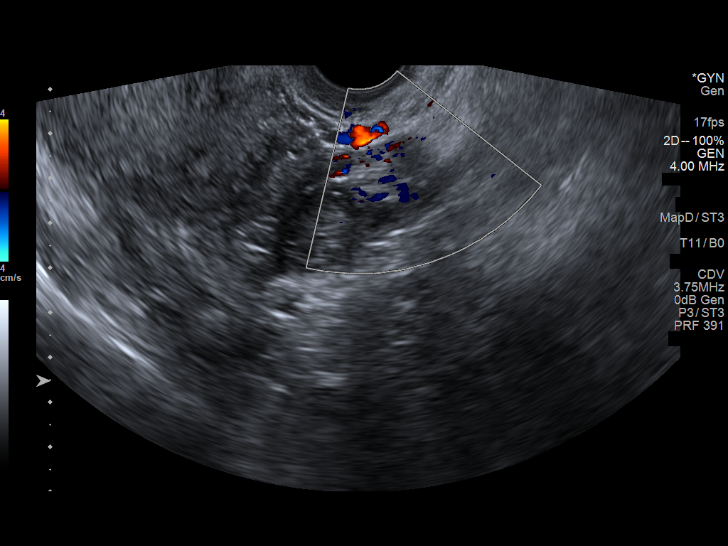

[14 of 25 positions shown; findings below may reference images not displayed]

FINDINGS: Uterus

Measurements: 8 x 3.7 x 5.4 cm = volume: 82.9 mL. No fibroids or
other mass visualized.

Endometrium

Thickness: 6.7 mm.  No focal abnormality visualized.

Right ovary

Measurements: 3.5 x 2.3 x 2.8 cm = volume: 11.3 mL. Normal
appearance/no adnexal mass.

Left ovary

Measurements: 3 x 2.2 x 2.4 cm = volume: 8.4 mL. Normal
appearance/no adnexal mass.

Other findings

No abnormal free fluid.
IMPRESSION: Negative pelvic ultrasound

## 2022-11-21 DIAGNOSIS — M7662 Achilles tendinitis, left leg: Secondary | ICD-10-CM | POA: Diagnosis not present

## 2023-01-06 DIAGNOSIS — Z6841 Body Mass Index (BMI) 40.0 and over, adult: Secondary | ICD-10-CM | POA: Diagnosis not present

## 2023-01-06 DIAGNOSIS — M7662 Achilles tendinitis, left leg: Secondary | ICD-10-CM | POA: Diagnosis not present

## 2023-03-03 DIAGNOSIS — M7662 Achilles tendinitis, left leg: Secondary | ICD-10-CM | POA: Diagnosis not present

## 2023-04-20 DIAGNOSIS — H538 Other visual disturbances: Secondary | ICD-10-CM | POA: Diagnosis not present

## 2023-05-04 DIAGNOSIS — K219 Gastro-esophageal reflux disease without esophagitis: Secondary | ICD-10-CM | POA: Diagnosis not present

## 2023-05-07 DIAGNOSIS — Z713 Dietary counseling and surveillance: Secondary | ICD-10-CM | POA: Diagnosis not present

## 2023-05-11 DIAGNOSIS — Z713 Dietary counseling and surveillance: Secondary | ICD-10-CM | POA: Diagnosis not present

## 2023-05-11 DIAGNOSIS — Z01818 Encounter for other preprocedural examination: Secondary | ICD-10-CM | POA: Diagnosis not present

## 2023-05-11 DIAGNOSIS — E049 Nontoxic goiter, unspecified: Secondary | ICD-10-CM | POA: Diagnosis not present

## 2023-05-11 DIAGNOSIS — K219 Gastro-esophageal reflux disease without esophagitis: Secondary | ICD-10-CM | POA: Diagnosis not present

## 2023-05-11 DIAGNOSIS — R948 Abnormal results of function studies of other organs and systems: Secondary | ICD-10-CM | POA: Diagnosis not present

## 2023-05-11 DIAGNOSIS — R635 Abnormal weight gain: Secondary | ICD-10-CM | POA: Diagnosis not present

## 2023-05-18 DIAGNOSIS — K30 Functional dyspepsia: Secondary | ICD-10-CM | POA: Diagnosis not present

## 2023-05-18 DIAGNOSIS — K219 Gastro-esophageal reflux disease without esophagitis: Secondary | ICD-10-CM | POA: Diagnosis not present

## 2023-05-18 DIAGNOSIS — Z4682 Encounter for fitting and adjustment of non-vascular catheter: Secondary | ICD-10-CM | POA: Diagnosis not present

## 2023-05-28 DIAGNOSIS — N939 Abnormal uterine and vaginal bleeding, unspecified: Secondary | ICD-10-CM | POA: Diagnosis not present

## 2023-05-28 DIAGNOSIS — F4320 Adjustment disorder, unspecified: Secondary | ICD-10-CM | POA: Diagnosis not present

## 2023-06-01 DIAGNOSIS — E010 Iodine-deficiency related diffuse (endemic) goiter: Secondary | ICD-10-CM | POA: Diagnosis not present

## 2023-06-01 DIAGNOSIS — R221 Localized swelling, mass and lump, neck: Secondary | ICD-10-CM | POA: Diagnosis not present

## 2023-06-03 DIAGNOSIS — R7303 Prediabetes: Secondary | ICD-10-CM | POA: Diagnosis not present

## 2023-06-03 DIAGNOSIS — N921 Excessive and frequent menstruation with irregular cycle: Secondary | ICD-10-CM | POA: Diagnosis not present

## 2023-06-08 DIAGNOSIS — Z713 Dietary counseling and surveillance: Secondary | ICD-10-CM | POA: Diagnosis not present

## 2023-06-08 DIAGNOSIS — F4320 Adjustment disorder, unspecified: Secondary | ICD-10-CM | POA: Diagnosis not present

## 2023-06-11 DIAGNOSIS — R7303 Prediabetes: Secondary | ICD-10-CM | POA: Diagnosis not present

## 2023-06-11 DIAGNOSIS — Z6841 Body Mass Index (BMI) 40.0 and over, adult: Secondary | ICD-10-CM | POA: Diagnosis not present

## 2023-06-11 DIAGNOSIS — Z713 Dietary counseling and surveillance: Secondary | ICD-10-CM | POA: Diagnosis not present

## 2023-06-17 DIAGNOSIS — H18603 Keratoconus, unspecified, bilateral: Secondary | ICD-10-CM | POA: Diagnosis not present

## 2023-06-17 DIAGNOSIS — H539 Unspecified visual disturbance: Secondary | ICD-10-CM | POA: Diagnosis not present

## 2023-07-06 DIAGNOSIS — N921 Excessive and frequent menstruation with irregular cycle: Secondary | ICD-10-CM | POA: Diagnosis not present

## 2023-08-17 DIAGNOSIS — Z713 Dietary counseling and surveillance: Secondary | ICD-10-CM | POA: Diagnosis not present

## 2023-09-07 DIAGNOSIS — R7303 Prediabetes: Secondary | ICD-10-CM | POA: Diagnosis not present

## 2023-09-07 DIAGNOSIS — Z713 Dietary counseling and surveillance: Secondary | ICD-10-CM | POA: Diagnosis not present

## 2023-09-07 DIAGNOSIS — Z6841 Body Mass Index (BMI) 40.0 and over, adult: Secondary | ICD-10-CM | POA: Diagnosis not present

## 2023-10-05 DIAGNOSIS — Z713 Dietary counseling and surveillance: Secondary | ICD-10-CM | POA: Diagnosis not present

## 2023-10-05 DIAGNOSIS — Z6841 Body Mass Index (BMI) 40.0 and over, adult: Secondary | ICD-10-CM | POA: Diagnosis not present

## 2023-10-05 DIAGNOSIS — R7303 Prediabetes: Secondary | ICD-10-CM | POA: Diagnosis not present

## 2023-10-05 DIAGNOSIS — G4730 Sleep apnea, unspecified: Secondary | ICD-10-CM | POA: Diagnosis not present

## 2023-10-22 DIAGNOSIS — R0602 Shortness of breath: Secondary | ICD-10-CM | POA: Diagnosis not present

## 2023-10-22 DIAGNOSIS — J101 Influenza due to other identified influenza virus with other respiratory manifestations: Secondary | ICD-10-CM | POA: Diagnosis not present

## 2023-10-22 DIAGNOSIS — R059 Cough, unspecified: Secondary | ICD-10-CM | POA: Diagnosis not present

## 2023-11-09 DIAGNOSIS — Z6841 Body Mass Index (BMI) 40.0 and over, adult: Secondary | ICD-10-CM | POA: Diagnosis not present

## 2023-11-09 DIAGNOSIS — R7303 Prediabetes: Secondary | ICD-10-CM | POA: Diagnosis not present

## 2023-11-09 DIAGNOSIS — Z713 Dietary counseling and surveillance: Secondary | ICD-10-CM | POA: Diagnosis not present

## 2023-12-07 DIAGNOSIS — Z713 Dietary counseling and surveillance: Secondary | ICD-10-CM | POA: Diagnosis not present

## 2023-12-07 DIAGNOSIS — R7303 Prediabetes: Secondary | ICD-10-CM | POA: Diagnosis not present

## 2023-12-07 DIAGNOSIS — Z6841 Body Mass Index (BMI) 40.0 and over, adult: Secondary | ICD-10-CM | POA: Diagnosis not present

## 2024-01-11 DIAGNOSIS — Z713 Dietary counseling and surveillance: Secondary | ICD-10-CM | POA: Diagnosis not present

## 2024-01-18 DIAGNOSIS — Z01818 Encounter for other preprocedural examination: Secondary | ICD-10-CM | POA: Diagnosis not present

## 2024-01-18 DIAGNOSIS — Z713 Dietary counseling and surveillance: Secondary | ICD-10-CM | POA: Diagnosis not present

## 2024-01-18 DIAGNOSIS — R7303 Prediabetes: Secondary | ICD-10-CM | POA: Diagnosis not present

## 2024-01-25 DIAGNOSIS — Z01818 Encounter for other preprocedural examination: Secondary | ICD-10-CM | POA: Diagnosis not present

## 2024-01-25 DIAGNOSIS — R7303 Prediabetes: Secondary | ICD-10-CM | POA: Diagnosis not present

## 2024-01-25 DIAGNOSIS — Z6841 Body Mass Index (BMI) 40.0 and over, adult: Secondary | ICD-10-CM | POA: Diagnosis not present

## 2024-02-02 DIAGNOSIS — K660 Peritoneal adhesions (postprocedural) (postinfection): Secondary | ICD-10-CM | POA: Diagnosis not present

## 2024-02-02 DIAGNOSIS — Z87891 Personal history of nicotine dependence: Secondary | ICD-10-CM | POA: Diagnosis not present

## 2024-02-02 DIAGNOSIS — K2950 Unspecified chronic gastritis without bleeding: Secondary | ICD-10-CM | POA: Diagnosis not present

## 2024-02-02 DIAGNOSIS — Z6841 Body Mass Index (BMI) 40.0 and over, adult: Secondary | ICD-10-CM | POA: Diagnosis not present

## 2024-02-02 DIAGNOSIS — G4730 Sleep apnea, unspecified: Secondary | ICD-10-CM | POA: Diagnosis not present

## 2024-02-02 DIAGNOSIS — Z79899 Other long term (current) drug therapy: Secondary | ICD-10-CM | POA: Diagnosis not present

## 2024-02-03 DIAGNOSIS — K660 Peritoneal adhesions (postprocedural) (postinfection): Secondary | ICD-10-CM | POA: Diagnosis not present

## 2024-02-03 DIAGNOSIS — Z87891 Personal history of nicotine dependence: Secondary | ICD-10-CM | POA: Diagnosis not present

## 2024-02-03 DIAGNOSIS — Z79899 Other long term (current) drug therapy: Secondary | ICD-10-CM | POA: Diagnosis not present

## 2024-02-03 DIAGNOSIS — G4730 Sleep apnea, unspecified: Secondary | ICD-10-CM | POA: Diagnosis not present

## 2024-02-03 DIAGNOSIS — Z6841 Body Mass Index (BMI) 40.0 and over, adult: Secondary | ICD-10-CM | POA: Diagnosis not present

## 2024-02-23 DIAGNOSIS — F4320 Adjustment disorder, unspecified: Secondary | ICD-10-CM | POA: Diagnosis not present

## 2024-02-29 DIAGNOSIS — E669 Obesity, unspecified: Secondary | ICD-10-CM | POA: Diagnosis not present

## 2024-02-29 DIAGNOSIS — Z6841 Body Mass Index (BMI) 40.0 and over, adult: Secondary | ICD-10-CM | POA: Diagnosis not present

## 2024-02-29 DIAGNOSIS — Z903 Acquired absence of stomach [part of]: Secondary | ICD-10-CM | POA: Diagnosis not present

## 2024-02-29 DIAGNOSIS — Z713 Dietary counseling and surveillance: Secondary | ICD-10-CM | POA: Diagnosis not present

## 2024-03-08 DIAGNOSIS — Z903 Acquired absence of stomach [part of]: Secondary | ICD-10-CM | POA: Diagnosis not present

## 2024-03-08 DIAGNOSIS — Z713 Dietary counseling and surveillance: Secondary | ICD-10-CM | POA: Diagnosis not present

## 2024-03-08 DIAGNOSIS — Z6841 Body Mass Index (BMI) 40.0 and over, adult: Secondary | ICD-10-CM | POA: Diagnosis not present

## 2024-05-26 DIAGNOSIS — Z713 Dietary counseling and surveillance: Secondary | ICD-10-CM | POA: Diagnosis not present

## 2024-05-26 DIAGNOSIS — Z903 Acquired absence of stomach [part of]: Secondary | ICD-10-CM | POA: Diagnosis not present

## 2024-06-13 DIAGNOSIS — H18623 Keratoconus, unstable, bilateral: Secondary | ICD-10-CM | POA: Diagnosis not present

## 2024-08-08 DIAGNOSIS — E66813 Obesity, class 3: Secondary | ICD-10-CM | POA: Diagnosis not present

## 2024-08-08 DIAGNOSIS — Z01818 Encounter for other preprocedural examination: Secondary | ICD-10-CM | POA: Diagnosis not present

## 2024-08-08 DIAGNOSIS — Z903 Acquired absence of stomach [part of]: Secondary | ICD-10-CM | POA: Diagnosis not present

## 2024-08-08 DIAGNOSIS — Z6841 Body Mass Index (BMI) 40.0 and over, adult: Secondary | ICD-10-CM | POA: Diagnosis not present

## 2024-08-08 DIAGNOSIS — Z713 Dietary counseling and surveillance: Secondary | ICD-10-CM | POA: Diagnosis not present
